# Patient Record
Sex: Female | Born: 2000 | Race: Black or African American | Hispanic: No | Smoking: Never smoker
Health system: Southern US, Community
[De-identification: ages and names within clinical notes are randomized; demographics above are authoritative.]

## PROBLEM LIST (undated history)

## (undated) DIAGNOSIS — B338 Other specified viral diseases: Secondary | ICD-10-CM

## (undated) DIAGNOSIS — O039 Complete or unspecified spontaneous abortion without complication: Secondary | ICD-10-CM

## (undated) DIAGNOSIS — F32A Depression, unspecified: Secondary | ICD-10-CM

## (undated) DIAGNOSIS — F410 Panic disorder [episodic paroxysmal anxiety] without agoraphobia: Secondary | ICD-10-CM

## (undated) DIAGNOSIS — B974 Respiratory syncytial virus as the cause of diseases classified elsewhere: Secondary | ICD-10-CM

## (undated) HISTORY — DX: Complete or unspecified spontaneous abortion without complication: O03.9

## (undated) HISTORY — DX: Depression, unspecified: F32.A

---
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
## 2021-06-05 DIAGNOSIS — Z713 Dietary counseling and surveillance: Secondary | ICD-10-CM | POA: Diagnosis not present

## 2021-06-05 DIAGNOSIS — Z68.41 Body mass index (BMI) pediatric, 85th percentile to less than 95th percentile for age: Secondary | ICD-10-CM | POA: Diagnosis not present

## 2021-06-05 DIAGNOSIS — G43809 Other migraine, not intractable, without status migrainosus: Secondary | ICD-10-CM | POA: Diagnosis not present

## 2021-06-05 DIAGNOSIS — Z113 Encounter for screening for infections with a predominantly sexual mode of transmission: Secondary | ICD-10-CM | POA: Diagnosis not present

## 2021-06-05 DIAGNOSIS — Z0001 Encounter for general adult medical examination with abnormal findings: Secondary | ICD-10-CM | POA: Diagnosis not present

## 2021-06-05 DIAGNOSIS — Z23 Encounter for immunization: Secondary | ICD-10-CM | POA: Diagnosis not present

## 2021-09-25 ENCOUNTER — Encounter: Payer: Self-pay | Admitting: Obstetrics and Gynecology

## 2021-09-25 ENCOUNTER — Ambulatory Visit: Payer: BC Managed Care – PPO | Admitting: Obstetrics and Gynecology

## 2021-09-25 ENCOUNTER — Other Ambulatory Visit: Payer: Self-pay

## 2021-09-25 VITALS — BP 132/74 | HR 111 | Ht 61.0 in | Wt 142.9 lb

## 2021-09-25 DIAGNOSIS — Z3200 Encounter for pregnancy test, result unknown: Secondary | ICD-10-CM

## 2021-09-25 LAB — POCT URINE PREGNANCY: Preg Test, Ur: POSITIVE — AB

## 2021-09-25 NOTE — Progress Notes (Signed)
HPI:      [name] is a 21 y.o. G1P0 who LMP was Patient's last menstrual period was 07/18/2021 (exact date).  Subjective:   She presents today after being seen elsewhere and having 2 ultrasounds several days apart both showing a baby present but without a heartbeat.  9-week sac 5-week crown-rump length. (All the above information given by the patient -actual medical records not available) Patient has not been counseled regarding possibility of miscarriage or nonviability of baby. She is taking prenatal vitamins. She reports that she has not had any bleeding or cramping.    Hx: The following portions of the patient's history were reviewed and updated as appropriate:             She  has a past medical history of Panic attacks and RSV (respiratory syncytial virus infection). She does not have any pertinent problems on file. She  has no past surgical history on file. Her family history includes Cancer in her maternal aunt, maternal grandmother, and paternal grandfather; Heart disease in her maternal aunt, maternal grandmother, paternal grandmother, and paternal uncle; Hypertension in her maternal grandmother, mother, and paternal grandmother. She  reports that she has never smoked. She does not have any smokeless tobacco history on file. She reports that she does not drink alcohol and does not use drugs. She has a current medication list which includes the following prescription(s): prenatal vit-fe fumarate-fa. She has No Known Allergies.       Review of Systems:  Review of Systems  Constitutional: Denied constitutional symptoms, night sweats, recent illness, fatigue, fever, insomnia and weight loss.  Eyes: Denied eye symptoms, eye pain, photophobia, vision change and visual disturbance.  Ears/Nose/Throat/Neck: Denied ear, nose, throat or neck symptoms, hearing loss, nasal discharge, sinus congestion and sore throat.  Cardiovascular: Denied cardiovascular symptoms, arrhythmia, chest  pain/pressure, edema, exercise intolerance, orthopnea and palpitations.  Respiratory: Denied pulmonary symptoms, asthma, pleuritic pain, productive sputum, cough, dyspnea and wheezing.  Gastrointestinal: Denied, gastro-esophageal reflux, melena, nausea and vomiting.  Genitourinary: Denied genitourinary symptoms including symptomatic vaginal discharge, pelvic relaxation issues, and urinary complaints.  Musculoskeletal: Denied musculoskeletal symptoms, stiffness, swelling, muscle weakness and myalgia.  Dermatologic: Denied dermatology symptoms, rash and scar.  Neurologic: Denied neurology symptoms, dizziness, headache, neck pain and syncope.  Psychiatric: Denied psychiatric symptoms, anxiety and depression.  Endocrine: Denied endocrine symptoms including hot flashes and night sweats.   Meds:   Current Outpatient Medications on File Prior to Visit  Medication Sig Dispense Refill   Prenatal Vit-Fe Fumarate-FA (PRENATAL PO) Take by mouth.     No current facility-administered medications on file prior to visit.      Objective:     Vitals:   09/25/21 0857  BP: 132/74  Pulse: (!) 111   Filed Weights   09/25/21 0857  Weight: 142 lb 14.4 oz (64.8 kg)                        Assessment:    G1P0 Patient Active Problem List   Diagnosis Date Noted   Altered mental status 01/30/2014     1. Possible pregnancy, not yet confirmed     Based on 2 previous ultrasounds showing crown-rump length without a heartbeat this is likely a nonviable pregnancy.  Because I do not have access to the ultrasound I am only going by what the patient has told me.   Plan:            1.  Ultrasound as soon as possible to confirm viability.  2.  Patient is counseled regarding the possibility of miscarriage and options of management for miscarriage discussed.  3.  If baby viable or nonviable by ultrasound I recommend follow-up visit next week.  4.  Warning signs of possible miscarriage discussed with  patient.   Orders Orders Placed This Encounter  Procedures   POCT urine pregnancy    No orders of the defined types were placed in this encounter.     F/U  Return in about 1 week (around 10/02/2021). I spent 31 minutes involved in the care of this patient preparing to see the patient by obtaining and reviewing her medical history (including labs, imaging tests and prior procedures), documenting clinical information in the electronic health record (EHR), counseling and coordinating care plans, writing and sending prescriptions, ordering tests or procedures and in direct communicating with the patient and medical staff discussing pertinent items from her history and physical exam.  Finis Bud, M.D. 09/25/2021 9:25 AM

## 2021-09-25 NOTE — Progress Notes (Signed)
Patient presents today for pregnancy confirmation. UPT resulted in positive. Patient states prior ultrasound with [facility]. Patient states no other questions or concerns.

## 2021-09-26 ENCOUNTER — Other Ambulatory Visit: Payer: Self-pay | Admitting: Obstetrics and Gynecology

## 2021-09-26 ENCOUNTER — Other Ambulatory Visit (INDEPENDENT_AMBULATORY_CARE_PROVIDER_SITE_OTHER): Payer: BC Managed Care – PPO

## 2021-09-26 DIAGNOSIS — O3680X0 Pregnancy with inconclusive fetal viability, not applicable or unspecified: Secondary | ICD-10-CM

## 2021-09-28 ENCOUNTER — Emergency Department: Payer: BC Managed Care – PPO

## 2021-09-28 ENCOUNTER — Emergency Department
Admission: EM | Admit: 2021-09-28 | Discharge: 2021-09-28 | Disposition: A | Discharge status: Home / Self Care | Payer: BC Managed Care – PPO | Attending: Emergency Medicine | Admitting: Emergency Medicine

## 2021-09-28 ENCOUNTER — Encounter: Payer: Self-pay | Admitting: Emergency Medicine

## 2021-09-28 ENCOUNTER — Other Ambulatory Visit: Payer: Self-pay

## 2021-09-28 DIAGNOSIS — O209 Hemorrhage in early pregnancy, unspecified: Secondary | ICD-10-CM | POA: Diagnosis not present

## 2021-09-28 DIAGNOSIS — Z3A00 Weeks of gestation of pregnancy not specified: Secondary | ICD-10-CM | POA: Diagnosis not present

## 2021-09-28 DIAGNOSIS — N939 Abnormal uterine and vaginal bleeding, unspecified: Secondary | ICD-10-CM

## 2021-09-28 DIAGNOSIS — O039 Complete or unspecified spontaneous abortion without complication: Secondary | ICD-10-CM | POA: Insufficient documentation

## 2021-09-28 LAB — BASIC METABOLIC PANEL WITH GFR
Anion gap: 11 (ref 5–15)
BUN: 6 mg/dL (ref 6–20)
CO2: 23 mmol/L (ref 22–32)
Calcium: 9.8 mg/dL (ref 8.9–10.3)
Chloride: 105 mmol/L (ref 98–111)
Creatinine, Ser: 0.75 mg/dL (ref 0.44–1.00)
GFR, Estimated: >60 mL/min
Glucose, Bld: 83 mg/dL (ref 70–99)
Potassium: 3.8 mmol/L (ref 3.5–5.1)
Sodium: 139 mmol/L (ref 135–145)

## 2021-09-28 LAB — URINALYSIS, COMPLETE (UACMP) WITH MICROSCOPIC
Bacteria, UA: NONE SEEN
RBC / HPF: >50 RBC/hpf — ABNORMAL HIGH (ref 0–5)
Specific Gravity, Urine: 1.026 (ref 1.005–1.030)
Squamous Epithelial / HPF: NONE SEEN (ref 0–5)

## 2021-09-28 LAB — CBC
HCT: 44.5 % (ref 36.0–46.0)
Hemoglobin: 14.3 g/dL (ref 12.0–15.0)
MCH: 28.7 pg (ref 26.0–34.0)
MCHC: 32.1 g/dL (ref 30.0–36.0)
MCV: 89.2 fL (ref 80.0–100.0)
Platelets: 263 10*3/uL (ref 150–400)
RBC: 4.99 MIL/uL (ref 3.87–5.11)
RDW: 12.7 % (ref 11.5–15.5)
WBC: 18 10*3/uL — ABNORMAL HIGH (ref 4.0–10.5)
nRBC: 0 % (ref 0.0–0.2)

## 2021-09-28 LAB — ABO/RH: ABO/RH(D): B POS

## 2021-09-28 LAB — POC URINE PREG, ED: Preg Test, Ur: POSITIVE — AB

## 2021-09-28 LAB — HCG, QUANTITATIVE, PREGNANCY: hCG, Beta Chain, Quant, S: 3784 m[IU]/mL — ABNORMAL HIGH (ref <5)

## 2021-09-28 MED ORDER — ACETAMINOPHEN 500 MG PO TABS
1000.0000 mg | ORAL_TABLET | Freq: Once | ORAL | Status: AC
  Administered 2021-09-28: 1000 mg via ORAL
  Filled 2021-09-28: qty 2

## 2021-09-28 MED ORDER — OXYCODONE HCL 5 MG PO TABS
5.0000 mg | ORAL_TABLET | Freq: Once | ORAL | Status: AC
  Administered 2021-09-28: 5 mg via ORAL
  Filled 2021-09-28: qty 1

## 2021-09-28 MED ORDER — HYDROCODONE-ACETAMINOPHEN 5-325 MG PO TABS: 1.0000 | ORAL_TABLET | Freq: Four times a day (QID) | ORAL | 0 refills | Status: DC | PRN

## 2021-09-28 NOTE — ED Notes (Addendum)
Pt presents to ED with lower abdominal pain. Pt states it started last night and noticed dark red blood. Pt states she has had to change pads every hour and pads have been saturated each time she changes pad. Pt states she is [redacted] weeks pregnant. Pt denies having NVD.

## 2021-09-28 NOTE — ED Notes (Signed)
Pt transported to ultrasound.

## 2021-09-28 NOTE — ED Provider Notes (Signed)
[facility] Provider Note    Event Date/Time   First MD Initiated Contact with Patient 09/28/21 1841     (approximate)   History   Abdominal Pain   HPI  Sherrie Marsan is a 21 y.o. female  who, per ob/gyn note dated 09/25/2021 was seen for possible pregnancy. Apparently per that note the patient had already had two [place] without cardiac activity present although the ob/gyn did not see the records directly. She presents to the emergency department today because of concern for miscarriage. She is not answering questions due to the pain so answers were provided by companion at bedside. The symptoms started yesterday and consisted of pain and bleeding. The pain is severe. It has been constant. The patient states she was told to expect this from her ob gyn.    Physical Exam   Triage Vital Signs: ED Triage Vitals  Enc Vitals Group     BP 09/28/21 1718 (!) 143/101     Pulse Rate 09/28/21 1718 (!) 113     Resp 09/28/21 1718 18     Temp 09/28/21 1718 98.2 F (36.8 C)     Temp Source 09/28/21 1718 Oral     SpO2 09/28/21 1718 99 %     Weight 09/28/21 1716 142 lb (64.4 kg)     Height 09/28/21 1716 5\' 1"  (1.549 m)     Head Circumference --      Peak Flow --      Pain Score 09/28/21 1716 9     Pain Loc --      Pain Edu? --      Excl. in GC? --     Most recent vital signs: Vitals:   09/28/21 1718  BP: (!) 143/101  Pulse: (!) 113  Resp: 18  Temp: 98.2 F (36.8 C)  SpO2: 99%    General: Awake, no distress.  CV:  Good peripheral perfusion.  Resp:  Normal effort.  Abd:  No distention. Tender to palpation in the lower abdomen.   ED Results / Procedures / Treatments   Labs (all labs ordered are listed, but only abnormal results are displayed) Labs Reviewed  CBC - Abnormal; Notable for the following components:      Result Value   WBC 18.0 (*)    All other components within normal limits  HCG, QUANTITATIVE, PREGNANCY - Abnormal; Notable for the  following components:   hCG, Beta Chain, Quant, S 3,784 (*)    All other components within normal limits  URINALYSIS, COMPLETE (UACMP) WITH MICROSCOPIC - Abnormal; Notable for the following components:   Color, Urine RED (*)    APPearance TURBID (*)    Glucose, UA   (*)    Value: TEST NOT REPORTED DUE TO COLOR INTERFERENCE OF URINE PIGMENT   Hgb urine dipstick   (*)    Value: TEST NOT REPORTED DUE TO COLOR INTERFERENCE OF URINE PIGMENT   Bilirubin Urine   (*)    Value: TEST NOT REPORTED DUE TO COLOR INTERFERENCE OF URINE PIGMENT   Ketones, ur   (*)    Value: TEST NOT REPORTED DUE TO COLOR INTERFERENCE OF URINE PIGMENT   Protein, ur   (*)    Value: TEST NOT REPORTED DUE TO COLOR INTERFERENCE OF URINE PIGMENT   Nitrite   (*)    Value: TEST NOT REPORTED DUE TO COLOR INTERFERENCE OF URINE PIGMENT   Leukocytes,Ua   (*)    Value: TEST NOT REPORTED DUE TO COLOR  INTERFERENCE OF URINE PIGMENT   RBC / HPF >50 (*)    All other components within normal limits  POC URINE PREG, ED - Abnormal; Notable for the following components:   Preg Test, Ur POSITIVE (*)    All other components within normal limits  BASIC METABOLIC PANEL  ABO/RH     EKG  None   RADIOLOGY Ob US IMPRESSION:  Previously seen intrauterine gestation no longer visualized which  would be compatible with spontaneous abortion. However,  study/visualization is limited and patient refused transvaginal  imaging. Patient reports scheduled follow-up ultrasound at [facility] office  on Thursday.     PROCEDURES:  Critical Care performed: No  Procedures   MEDICATIONS ORDERED IN ED: Medications  acetaminophen (TYLENOL) tablet 1,000 mg (has no administration in time range)     IMPRESSION / MDM / ASSESSMENT AND PLAN / ED COURSE  I reviewed the triage vital signs and the nursing notes.                              Differential diagnosis includes, but is not limited to, miscarriage, pain associate with pregnancy.  Patient  presented to the emergency department today because of concerns for abdominal pain and vaginal bleeding in the setting of early pregnancy which has apparently had  ultrasounds which did not show cardiac activity.  Ultrasound today could not visualize the embryo.  At this time given history of previous ultrasounds which showed no cardiac activity do think unfortunately that her pain and bleeding today represents a miscarriage.  I discussed this with the patient.  Given significant pain she was given narcotic pain medication which did help relieve her pain.  Will plan on discharging with short course of pain medication and discussed close follow-up with OB/GYN.     FINAL CLINICAL IMPRESSION(S) / ED DIAGNOSES   Final diagnoses:  Abnormal vaginal bleeding  Miscarriage     Rx / DC Orders   ED Discharge Orders          Ordered    HYDROcodone-acetaminophen (NORCO/VICODIN) 5-325 MG tablet  Every 6 hours PRN        09/28/21 2140             Note:  This document was prepared using Dragon voice recognition software and may include unintentional dictation errors.    Phineas Semen, MD 09/28/21 [id number]

## 2021-09-28 NOTE — ED Notes (Signed)
Pt verbalized understanding of discharge instructions, prescription medication, and follow-up care instructions. Pt advised if any symptoms worsen to return to ED. E-signature not available due to signature pad not working at this time.

## 2021-09-28 NOTE — Discharge Instructions (Signed)
Please seek medical attention for any high fevers, chest pain, shortness of breath, change in behavior, persistent vomiting, bloody stool or any other new or concerning symptoms.  

## 2021-09-28 NOTE — ED Notes (Addendum)
Ultrasound called this RN to notify that pt refused transvaginal ultrasound but pt states she has a follow-up appointment on Thursday.

## 2021-09-28 NOTE — ED Triage Notes (Signed)
Pt via POV from home. Pt c/o vaginal bleeding with clots and abd pain that started last night. Pt believes  she is having a miscarriage pt is about [redacted] week pregnant. Pt is A&OX4 and NAD

## 2021-09-29 ENCOUNTER — Telehealth: Payer: Self-pay | Admitting: Obstetrics and Gynecology

## 2021-09-29 NOTE — Telephone Encounter (Signed)
Pt was seen in [facility] ER yesterday stating that she had a miscarriage- she was directed to follow up with Dr.Evans for evaluation for D&C. Pt is still experiencing bleeding and some pain- manageable with meds rx in er. I went ahead and scheduled pt for 3-1 at 3:30. Please advise as far as scheduling.

## 2021-10-01 ENCOUNTER — Encounter: Payer: BC Managed Care – PPO | Admitting: Obstetrics and Gynecology

## 2021-10-01 HISTORY — PX: WISDOM TOOTH EXTRACTION: SHX21

## 2021-10-02 ENCOUNTER — Encounter: Payer: PRIVATE HEALTH INSURANCE | Admitting: Obstetrics and Gynecology

## 2021-10-03 ENCOUNTER — Other Ambulatory Visit: Payer: Self-pay

## 2021-10-03 ENCOUNTER — Encounter: Payer: Self-pay | Admitting: Obstetrics and Gynecology

## 2021-10-03 ENCOUNTER — Ambulatory Visit: Payer: BC Managed Care – PPO | Admitting: Obstetrics and Gynecology

## 2021-10-03 VITALS — BP 122/84 | HR 87 | Ht 61.0 in | Wt 140.0 lb

## 2021-10-03 DIAGNOSIS — O039 Complete or unspecified spontaneous abortion without complication: Secondary | ICD-10-CM

## 2021-10-03 NOTE — Progress Notes (Signed)
HPI: ?     Ms. Curley Fayette is a 21 y.o. G1P0010 who LMP was Patient's last menstrual period was 07/18/2021 (exact date). ? ?Subjective:  ? ?She presents today after being seen in the emergency department approximately 1 week ago after having a spontaneous miscarriage.  She has had previous ultrasounds showing a crown-rump length without a heartbeat and an impending miscarriage was suspected. ?She presents today stating that her bleeding is currently light.  She occasionally has heavier bleeding with cramping but nothing like when she went to the emergency department. ?She continues to take prenatal vitamins. ?She reports that she has not yet decided upon whether she would become pregnant in the near future or if she would like birth control. ? ?  Hx: ?The following portions of the patient's history were reviewed and updated as appropriate: ?            She  has a past medical history of Miscarriage, Panic attacks, and RSV (respiratory syncytial virus infection). ?She does not have any pertinent problems on file. ?She  has no past surgical history on file. ?Her family history includes Cancer in her maternal aunt, maternal grandmother, and paternal grandfather; Heart disease in her maternal aunt, maternal grandmother, paternal grandmother, and paternal uncle; Hypertension in her maternal grandmother, mother, and paternal grandmother. ?She  reports that she has never smoked. She does not have any smokeless tobacco history on file. She reports that she does not drink alcohol and does not use drugs. ?She has a current medication list which includes the following prescription(s): acetaminophen, prenatal vit-fe fumarate-fa, and hydrocodone-acetaminophen. ?She has No Known Allergies. ?      ?Review of Systems:  ?Review of Systems ? ?Constitutional: Denied constitutional symptoms, night sweats, recent illness, fatigue, fever, insomnia and weight loss.  ?Eyes: Denied eye symptoms, eye pain, photophobia, vision change and  visual disturbance.  ?Ears/Nose/Throat/Neck: Denied ear, nose, throat or neck symptoms, hearing loss, nasal discharge, sinus congestion and sore throat.  ?Cardiovascular: Denied cardiovascular symptoms, arrhythmia, chest pain/pressure, edema, exercise intolerance, orthopnea and palpitations.  ?Respiratory: Denied pulmonary symptoms, asthma, pleuritic pain, productive sputum, cough, dyspnea and wheezing.  ?Gastrointestinal: Denied, gastro-esophageal reflux, melena, nausea and vomiting.  ?Genitourinary: Denied genitourinary symptoms including symptomatic vaginal discharge, pelvic relaxation issues, and urinary complaints.  ?Musculoskeletal: Denied musculoskeletal symptoms, stiffness, swelling, muscle weakness and myalgia.  ?Dermatologic: Denied dermatology symptoms, rash and scar.  ?Neurologic: Denied neurology symptoms, dizziness, headache, neck pain and syncope.  ?Psychiatric: Denied psychiatric symptoms, anxiety and depression.  ?Endocrine: Denied endocrine symptoms including hot flashes and night sweats.  ? ?Meds: ?  ?Current Outpatient Medications on File Prior to Visit  ?Medication Sig Dispense Refill  ? Acetaminophen (TYLENOL PO) Take by mouth.    ? Prenatal Vit-Fe Fumarate-FA (PRENATAL PO) Take by mouth.    ? HYDROcodone-acetaminophen (NORCO/VICODIN) 5-325 MG tablet Take 1 tablet by mouth every 6 (six) hours as needed for moderate pain. (Patient not taking: Reported on 10/03/2021) 10 tablet 0  ? ?No current facility-administered medications on file prior to visit.  ? ? ? ? ?Objective:  ?  ? ?Vitals:  ? 10/03/21 0832  ?BP: 122/84  ?Pulse: 87  ? ?Filed Weights  ? 10/03/21 0832  ?Weight: 140 lb (63.5 kg)  ? ?  ?          ?        ? ?Assessment:  ?  ?G1P0010 ?Patient Active Problem List  ? Diagnosis Date Noted  ? Altered mental status 01/30/2014  ? ?  ?  1. Miscarriage   ? ? Based on symptoms and ultrasound to consider this a complete miscarriage. ? ? ?Plan:  ?  ?       ? 1.  Plan follow-up in 4 weeks to discuss  future pregnancy versus birth control.  Patient feels she needs this time to consider her options.  All questions answered. ?Orders ?No orders of the defined types were placed in this encounter. ? ? No orders of the defined types were placed in this encounter. ?  ?  F/U ? Return in about 4 weeks (around 10/31/2021). ?I spent 18 minutes involved in the care of this patient preparing to see the patient by obtaining and reviewing her medical history (including labs, imaging tests and prior procedures), documenting clinical information in the electronic health record (EHR), counseling and coordinating care plans, writing and sending prescriptions, ordering tests or procedures and in direct communicating with the patient and medical staff discussing pertinent items from her history and physical exam. ? ?Elonda Husky, M.D. ?10/03/2021 ?9:05 AM ? ? ? ? ?

## 2021-10-03 NOTE — Progress Notes (Signed)
Patient presents today for recent miscarriage. Patient reports lightly bleeding along with cramping. She states cramping goes from moderate to severe and her bleeding increase while cramping.Patient states no questions at this time. ?

## 2021-10-29 ENCOUNTER — Ambulatory Visit: Payer: BC Managed Care – PPO | Admitting: Obstetrics and Gynecology

## 2021-10-29 ENCOUNTER — Encounter: Payer: Self-pay | Admitting: Obstetrics and Gynecology

## 2021-10-29 ENCOUNTER — Other Ambulatory Visit: Payer: Self-pay

## 2021-10-29 VITALS — BP 129/83 | HR 91 | Ht 61.0 in | Wt 138.1 lb

## 2021-10-29 DIAGNOSIS — O039 Complete or unspecified spontaneous abortion without complication: Secondary | ICD-10-CM

## 2021-10-29 DIAGNOSIS — Z3009 Encounter for other general counseling and advice on contraception: Secondary | ICD-10-CM

## 2021-10-29 NOTE — Progress Notes (Signed)
HPI: ?     [name] is a 21 y.o. G1P0010 who LMP was No LMP recorded. (Menstrual status: Other). ? ?Subjective:  ? ?She presents today for 1 month follow-up after miscarriage.  She reports she is no longer bleeding.  She thinks she might have ovulated 2 weeks ago.  She has had some significant sadness in the last few weeks but says that she is now starting to get better.  She would like to become pregnant again and has questions about how soon this should happen. ? ?  Hx: ?The following portions of the patient's history were reviewed and updated as appropriate: ?            She  has a past medical history of Miscarriage, Panic attacks, and RSV (respiratory syncytial virus infection). ?She does not have any pertinent problems on file. ?She  has no past surgical history on file. ?Her family history includes Cancer in her maternal aunt, maternal grandmother, and paternal grandfather; Heart disease in her maternal aunt, maternal grandmother, paternal grandmother, and paternal uncle; Hypertension in her maternal grandmother, mother, and paternal grandmother. ?She  reports that she has never smoked. She does not have any smokeless tobacco history on file. She reports that she does not drink alcohol and does not use drugs. ?She has a current medication list which includes the following prescription(s): prenatal vit-fe fumarate-fa. ?She has No Known Allergies. ?      ?Review of Systems:  ?Review of Systems ? ?Constitutional: Denied constitutional symptoms, night sweats, recent illness, fatigue, fever, insomnia and weight loss.  ?Eyes: Denied eye symptoms, eye pain, photophobia, vision change and visual disturbance.  ?Ears/Nose/Throat/Neck: Denied ear, nose, throat or neck symptoms, hearing loss, nasal discharge, sinus congestion and sore throat.  ?Cardiovascular: Denied cardiovascular symptoms, arrhythmia, chest pain/pressure, edema, exercise intolerance, orthopnea and palpitations.  ?Respiratory: Denied pulmonary  symptoms, asthma, pleuritic pain, productive sputum, cough, dyspnea and wheezing.  ?Gastrointestinal: Denied, gastro-esophageal reflux, melena, nausea and vomiting.  ?Genitourinary: Denied genitourinary symptoms including symptomatic vaginal discharge, pelvic relaxation issues, and urinary complaints.  ?Musculoskeletal: Denied musculoskeletal symptoms, stiffness, swelling, muscle weakness and myalgia.  ?Dermatologic: Denied dermatology symptoms, rash and scar.  ?Neurologic: Denied neurology symptoms, dizziness, headache, neck pain and syncope.  ?Psychiatric: Denied psychiatric symptoms, anxiety and depression.  ?Endocrine: Denied endocrine symptoms including hot flashes and night sweats.  ? ?Meds: ?  ?Current Outpatient Medications on File Prior to Visit  ?Medication Sig Dispense Refill  ? Prenatal Vit-Fe Fumarate-FA (PRENATAL PO) Take by mouth.    ? ?No current facility-administered medications on file prior to visit.  ? ? ? ? ?Objective:  ?  ? ?Vitals:  ? 10/29/21 0941  ?BP: 129/83  ?Pulse: 91  ? ?Filed Weights  ? 10/29/21 0941  ?Weight: 138 lb 1.6 oz (62.6 kg)  ? ?  ?          ?        ? ?Assessment:  ?  ?G1P0010 ?Patient Active Problem List  ? Diagnosis Date Noted  ? Altered mental status 01/30/2014  ? ?  ?1. Birth control counseling   ?2. Miscarriage   ? ? Patient with some appropriate sadness surrounding her miscarriage but is now doing better.  We discussed the possibility of antidepressants and she does not desire these and in my opinion does not need them at this time.  Desires pregnancy in the near future ? ? ?Plan:  ?  ?       ? 1.  Discussed miscarriage in detail.  Recommend 3 months from miscarriage before attempting pregnancy again.  Rationale discussed. ? 2.  To continue prenatal vitamins ? 3.  Inform us as soon as she becomes pregnant again. ? ? ?Orders ?No orders of the defined types were placed in this encounter. ? ? No orders of the defined types were placed in this encounter. ?  ?  F/U ? Return  for Annual Physical. ?I spent 20 minutes involved in the care of this patient preparing to see the patient by obtaining and reviewing her medical history (including labs, imaging tests and prior procedures), documenting clinical information in the electronic health record (EHR), counseling and coordinating care plans, writing and sending prescriptions, ordering tests or procedures and in direct communicating with the patient and medical staff discussing pertinent items from her history and physical exam. ? ?Elonda Husky, M.D. ?10/29/2021 ?10:01 AM ? ? ? ? ?

## 2021-10-29 NOTE — Progress Notes (Signed)
Patient presents today for follow-up post SAB. She reports feeling a little depressed and sad since the event. Patient reports she wants to try and get pregnant again. She also is curious when her period may return. Patient states no other questions or concerns at this time.  ?

## 2021-10-31 ENCOUNTER — Encounter: Payer: BC Managed Care – PPO | Admitting: Obstetrics and Gynecology

## 2021-11-07 ENCOUNTER — Encounter: Payer: PRIVATE HEALTH INSURANCE | Admitting: Obstetrics

## 2021-11-14 ENCOUNTER — Encounter: Payer: Self-pay | Admitting: Obstetrics

## 2022-01-14 ENCOUNTER — Encounter: Payer: Self-pay | Admitting: Obstetrics and Gynecology

## 2022-01-14 ENCOUNTER — Ambulatory Visit (INDEPENDENT_AMBULATORY_CARE_PROVIDER_SITE_OTHER): Payer: BC Managed Care – PPO | Admitting: Obstetrics and Gynecology

## 2022-01-14 VITALS — BP 127/82 | HR 81 | Ht 61.0 in | Wt 132.4 lb

## 2022-01-14 DIAGNOSIS — Z01419 Encounter for gynecological examination (general) (routine) without abnormal findings: Secondary | ICD-10-CM | POA: Diagnosis not present

## 2022-01-14 DIAGNOSIS — F418 Other specified anxiety disorders: Secondary | ICD-10-CM

## 2022-01-14 MED ORDER — SERTRALINE HCL 50 MG PO TABS: 50.0000 mg | ORAL_TABLET | Freq: Every day | ORAL | 0 refills | Status: DC

## 2022-01-14 NOTE — Progress Notes (Signed)
HPI:      Ms. Amberley Hamler is a 21 y.o. G1P0010 who LMP was Patient's last menstrual period was 01/11/2022 (exact date).  Subjective:   She presents today scheduled as an annual examination however she is not due for a Pap and has more pressing issues.  She reports that she has significant anxiety and depression daily since her miscarriage.  She says that she is often thinking "what if" regarding the baby regarding her life etc.  She ends up not sleeping well, not eating well, chewing her lip, grinding her teeth etc.  She says going to work does not help.  She is interested in receiving some counseling.  She reports that she has no significant thoughts of harming herself and no plan toward that end. She is having normal regular menses.  She is not sexually active and does not desire birth control at this time.    Hx: The following portions of the patient's history were reviewed and updated as appropriate:             She  has a past medical history of Miscarriage, Panic attacks, and RSV (respiratory syncytial virus infection). She does not have any pertinent problems on file. She  has no past surgical history on file. Her family history includes Cancer in her maternal aunt, maternal grandmother, and paternal grandfather; Heart disease in her maternal aunt, maternal grandmother, paternal grandmother, and paternal uncle; Hypertension in her maternal grandmother, mother, and paternal grandmother. She  reports that she has never smoked. She has never used smokeless tobacco. She reports that she does not drink alcohol and does not use drugs. She has a current medication list which includes the following prescription(s): prenatal vit-fe fumarate-fa. She has No Known Allergies.       Review of Systems:  Review of Systems  Constitutional: Denied constitutional symptoms, night sweats, recent illness, fatigue, fever, insomnia and weight loss.  Eyes: Denied eye symptoms, eye pain, photophobia, vision  change and visual disturbance.  Ears/Nose/Throat/Neck: Denied ear, nose, throat or neck symptoms, hearing loss, nasal discharge, sinus congestion and sore throat.  Cardiovascular: Denied cardiovascular symptoms, arrhythmia, chest pain/pressure, edema, exercise intolerance, orthopnea and palpitations.  Respiratory: Denied pulmonary symptoms, asthma, pleuritic pain, productive sputum, cough, dyspnea and wheezing.  Gastrointestinal: Denied, gastro-esophageal reflux, melena, nausea and vomiting.  Genitourinary: Denied genitourinary symptoms including symptomatic vaginal discharge, pelvic relaxation issues, and urinary complaints.  Musculoskeletal: Denied musculoskeletal symptoms, stiffness, swelling, muscle weakness and myalgia.  Dermatologic: Denied dermatology symptoms, rash and scar.  Neurologic: Denied neurology symptoms, dizziness, headache, neck pain and syncope.  Psychiatric: See HPI for additional information.  Endocrine: Denied endocrine symptoms including hot flashes and night sweats.   Meds:   Current Outpatient Medications on File Prior to Visit  Medication Sig Dispense Refill   Prenatal Vit-Fe Fumarate-FA (PRENATAL PO) Take by mouth.     No current facility-administered medications on file prior to visit.      Objective:     Vitals:   01/14/22 1337  BP: 127/82  Pulse: 81   Filed Weights   01/14/22 1337  Weight: 132 lb 6.4 oz (60.1 kg)                        Assessment:    G1P0010 Patient Active Problem List   Diagnosis Date Noted   Altered mental status 01/30/2014     1. Well woman exam with routine gynecological exam   2. Depression with anxiety  Plan:            1.  We have discussed depression in detail.  I have prescribed Zoloft.  2.  We have discussed grief counseling regarding fetal loss and literature was given for multiple health groups.  3.  We have discussed the benefits of counseling regarding depression and anxiety and I have given  her literature regarding several options that she can call and she is from.  4.  Plan follow-up for depression in 8 weeks. Orders No orders of the defined types were placed in this encounter.   No orders of the defined types were placed in this encounter.     F/U  Return in about 2 months (around 03/16/2022).  Elonda Husky, M.D. 01/14/2022 2:05 PM

## 2022-01-14 NOTE — Progress Notes (Signed)
Patients presents for annual exam today. She states that since her recent miscarriage in March she has been very depressed. Patient states sleeping and eating habits have gotten worse and she is interested in therapy. She states her cycles have restarted and have been light so far with moderate cramping, uninterested in birth control at this time. Patient is not due for pap smear due to age. Patient states no other questions or concerns at this time.

## 2022-03-17 ENCOUNTER — Ambulatory Visit: Payer: BC Managed Care – PPO | Admitting: Obstetrics and Gynecology

## 2022-03-17 ENCOUNTER — Encounter: Payer: Self-pay | Admitting: Obstetrics and Gynecology

## 2022-03-17 VITALS — BP 121/79 | HR 57 | Ht 61.0 in | Wt 132.6 lb

## 2022-03-17 DIAGNOSIS — F418 Other specified anxiety disorders: Secondary | ICD-10-CM

## 2022-03-17 MED ORDER — SERTRALINE HCL 50 MG PO TABS: 50.0000 mg | ORAL_TABLET | Freq: Every day | ORAL | 0 refills | Status: DC

## 2022-03-17 NOTE — Progress Notes (Signed)
Patient presents today for medication follow-up. She states since beginning medication and doing counseling she has noticed a big difference. Patient states today was hard coming into the office seeing other pregnant patients. PHQ-9: 9 GAD-7: 11No other concerns today.

## 2022-03-17 NOTE — Progress Notes (Signed)
HPI:      [name] is a 21 y.o. G1P0010 who LMP was Patient's last menstrual period was 02/10/2022 (approximate).  Subjective:   She presents today for follow-up of depression following a miscarriage.  She has been taking Zoloft and going to [facility] 1 time per week.  She says she is dramatically improved.  She says that she feels better every day.  She would like to continue the Zoloft and the counseling. She reports that she is not sexually active and does not desire anything of birth control at this time    Hx: The following portions of the patient's history were reviewed and updated as appropriate:             She  has a past medical history of Miscarriage, Panic attacks, and RSV (respiratory syncytial virus infection). She does not have any pertinent problems on file. She  has no past surgical history on file. Her family history includes Cancer in her maternal aunt, maternal grandmother, and paternal grandfather; Heart disease in her maternal aunt, maternal grandmother, paternal grandmother, and paternal uncle; Hypertension in her maternal grandmother, mother, and paternal grandmother. She  reports that she has never smoked. She has never used smokeless tobacco. She reports that she does not drink alcohol and does not use drugs. She has a current medication list which includes the following prescription(s): prenatal vit-fe fumarate-fa and sertraline. She has No Known Allergies.       Review of Systems:  Review of Systems  Constitutional: Denied constitutional symptoms, night sweats, recent illness, fatigue, fever, insomnia and weight loss.  Eyes: Denied eye symptoms, eye pain, photophobia, vision change and visual disturbance.  Ears/Nose/Throat/Neck: Denied ear, nose, throat or neck symptoms, hearing loss, nasal discharge, sinus congestion and sore throat.  Cardiovascular: Denied cardiovascular symptoms, arrhythmia, chest pain/pressure, edema, exercise intolerance,  orthopnea and palpitations.  Respiratory: Denied pulmonary symptoms, asthma, pleuritic pain, productive sputum, cough, dyspnea and wheezing.  Gastrointestinal: Denied, gastro-esophageal reflux, melena, nausea and vomiting.  Genitourinary: Denied genitourinary symptoms including symptomatic vaginal discharge, pelvic relaxation issues, and urinary complaints.  Musculoskeletal: Denied musculoskeletal symptoms, stiffness, swelling, muscle weakness and myalgia.  Dermatologic: Denied dermatology symptoms, rash and scar.  Neurologic: Denied neurology symptoms, dizziness, headache, neck pain and syncope.  Psychiatric: Denied psychiatric symptoms, anxiety and depression.  Endocrine: Denied endocrine symptoms including hot flashes and night sweats.   Meds:   Current Outpatient Medications on File Prior to Visit  Medication Sig Dispense Refill   Prenatal Vit-Fe Fumarate-FA (PRENATAL PO) Take by mouth.     No current facility-administered medications on file prior to visit.      Objective:     Vitals:   03/17/22 1439  BP: 121/79  Pulse: (!) 57   Filed Weights   03/17/22 1439  Weight: 132 lb 9.6 oz (60.1 kg)                        Assessment:    G1P0010 Patient Active Problem List   Diagnosis Date Noted   Altered mental status 01/30/2014     1. Depression with anxiety     Depression scores much improved-patient still going to counseling  Feels that Zoloft has made a difference   Plan:            1.  Continue Zoloft  2.  Encouraged to continue counseling  3.  Patient will inform [place] if she desires birth control. Orders No orders of the  defined types were placed in this encounter.    Meds ordered this encounter  Medications   sertraline (ZOLOFT) 50 MG tablet    Sig: Take 1 tablet (50 mg total) by mouth daily.    Dispense:  90 tablet    Refill:  0      F/U  No follow-ups on file. I spent 13 minutes involved in the care of this patient preparing to see the patient  by obtaining and reviewing her medical history (including labs, imaging tests and prior procedures), documenting clinical information in the electronic health record (EHR), counseling and coordinating care plans, writing and sending prescriptions, ordering tests or procedures and in direct communicating with the patient and medical staff discussing pertinent items from her history and physical exam.  Elonda Husky, M.D. 03/17/2022 2:48 PM

## 2022-04-27 DIAGNOSIS — Z113 Encounter for screening for infections with a predominantly sexual mode of transmission: Secondary | ICD-10-CM | POA: Diagnosis not present

## 2022-05-22 DIAGNOSIS — F331 Major depressive disorder, recurrent, moderate: Secondary | ICD-10-CM | POA: Diagnosis not present

## 2022-06-05 DIAGNOSIS — F331 Major depressive disorder, recurrent, moderate: Secondary | ICD-10-CM | POA: Diagnosis not present

## 2022-06-12 ENCOUNTER — Ambulatory Visit (INDEPENDENT_AMBULATORY_CARE_PROVIDER_SITE_OTHER): Payer: BC Managed Care – PPO

## 2022-06-12 ENCOUNTER — Other Ambulatory Visit: Payer: Self-pay

## 2022-06-12 VITALS — BP 132/88 | HR 90 | Ht 61.0 in | Wt 136.0 lb

## 2022-06-12 DIAGNOSIS — Z8759 Personal history of other complications of pregnancy, childbirth and the puerperium: Secondary | ICD-10-CM

## 2022-06-12 DIAGNOSIS — F331 Major depressive disorder, recurrent, moderate: Secondary | ICD-10-CM | POA: Diagnosis not present

## 2022-06-12 DIAGNOSIS — Z3201 Encounter for pregnancy test, result positive: Secondary | ICD-10-CM | POA: Diagnosis not present

## 2022-06-12 DIAGNOSIS — Z3200 Encounter for pregnancy test, result unknown: Secondary | ICD-10-CM

## 2022-06-12 LAB — POCT URINE PREGNANCY: Preg Test, Ur: POSITIVE — AB

## 2022-06-12 NOTE — Progress Notes (Signed)
Patient presents for evaluation of amenorrhea. She believes she could be pregnant.  Current symptoms also include,nausea and fatigue and some cramping.  Urine HCG: Briefly discussed pre-natal care options. She will schedule her appointments at check out.   LMP 05/14/2022

## 2022-06-13 LAB — BETA HCG QUANT (REF LAB): hCG Quant: 74 m[IU]/mL

## 2022-06-15 ENCOUNTER — Other Ambulatory Visit: Payer: Self-pay

## 2022-06-15 ENCOUNTER — Other Ambulatory Visit: Payer: BC Managed Care – PPO

## 2022-06-15 DIAGNOSIS — Z8759 Personal history of other complications of pregnancy, childbirth and the puerperium: Secondary | ICD-10-CM

## 2022-06-16 LAB — BETA HCG QUANT (REF LAB): hCG Quant: 150 m[IU]/mL

## 2022-06-19 DIAGNOSIS — F331 Major depressive disorder, recurrent, moderate: Secondary | ICD-10-CM | POA: Diagnosis not present

## 2022-06-29 ENCOUNTER — Other Ambulatory Visit: Payer: Self-pay

## 2022-06-29 ENCOUNTER — Encounter: Payer: Self-pay | Admitting: *Deleted

## 2022-06-29 ENCOUNTER — Emergency Department: Payer: BC Managed Care – PPO

## 2022-06-29 ENCOUNTER — Emergency Department
Admission: EM | Admit: 2022-06-29 | Discharge: 2022-06-29 | Disposition: A | Discharge status: Home / Self Care | Payer: BC Managed Care – PPO | Attending: Student in an Organized Health Care Education/Training Program | Admitting: Student in an Organized Health Care Education/Training Program

## 2022-06-29 DIAGNOSIS — O209 Hemorrhage in early pregnancy, unspecified: Secondary | ICD-10-CM | POA: Diagnosis not present

## 2022-06-29 DIAGNOSIS — Z3A00 Weeks of gestation of pregnancy not specified: Secondary | ICD-10-CM | POA: Diagnosis not present

## 2022-06-29 DIAGNOSIS — Z3A01 Less than 8 weeks gestation of pregnancy: Secondary | ICD-10-CM | POA: Diagnosis not present

## 2022-06-29 DIAGNOSIS — O3680X0 Pregnancy with inconclusive fetal viability, not applicable or unspecified: Secondary | ICD-10-CM | POA: Insufficient documentation

## 2022-06-29 DIAGNOSIS — O4690 Antepartum hemorrhage, unspecified, unspecified trimester: Secondary | ICD-10-CM

## 2022-06-29 LAB — CBC WITH DIFFERENTIAL/PLATELET
Abs Immature Granulocytes: 0.03 10*3/uL (ref 0.00–0.07)
Basophils Absolute: 0.1 10*3/uL (ref 0.0–0.1)
Basophils Relative: 0 %
Eosinophils Absolute: 0.1 10*3/uL (ref 0.0–0.5)
Eosinophils Relative: 1 %
HCT: 40.3 % (ref 36.0–46.0)
Hemoglobin: 13.1 g/dL (ref 12.0–15.0)
Immature Granulocytes: 0 %
Lymphocytes Relative: 26 %
Lymphs Abs: 2.9 10*3/uL (ref 0.7–4.0)
MCH: 28.7 pg (ref 26.0–34.0)
MCHC: 32.5 g/dL (ref 30.0–36.0)
MCV: 88.4 fL (ref 80.0–100.0)
Monocytes Absolute: 0.8 10*3/uL (ref 0.1–1.0)
Monocytes Relative: 7 %
Neutro Abs: 7.4 10*3/uL (ref 1.7–7.7)
Neutrophils Relative %: 66 %
Platelets: 268 10*3/uL (ref 150–400)
RBC: 4.56 MIL/uL (ref 3.87–5.11)
RDW: 12.4 % (ref 11.5–15.5)
WBC: 11.2 10*3/uL — ABNORMAL HIGH (ref 4.0–10.5)
nRBC: 0 % (ref 0.0–0.2)

## 2022-06-29 LAB — COMPREHENSIVE METABOLIC PANEL
ALT: 14 U/L (ref 0–44)
AST: 15 U/L (ref 15–41)
Albumin: 4.2 g/dL (ref 3.5–5.0)
Alkaline Phosphatase: 43 U/L (ref 38–126)
Anion gap: 7 (ref 5–15)
BUN: 10 mg/dL (ref 6–20)
CO2: 23 mmol/L (ref 22–32)
Calcium: 9.3 mg/dL (ref 8.9–10.3)
Chloride: 108 mmol/L (ref 98–111)
Creatinine, Ser: 0.66 mg/dL (ref 0.44–1.00)
GFR, Estimated: >60 mL/min (ref >60)
Glucose, Bld: 103 mg/dL — ABNORMAL HIGH (ref 70–99)
Potassium: 3.6 mmol/L (ref 3.5–5.1)
Sodium: 138 mmol/L (ref 135–145)
Total Bilirubin: 0.7 mg/dL (ref 0.3–1.2)
Total Protein: 6.9 g/dL (ref 6.5–8.1)

## 2022-06-29 LAB — URINALYSIS, ROUTINE W REFLEX MICROSCOPIC
Bilirubin Urine: NEGATIVE
Glucose, UA: NEGATIVE mg/dL
Ketones, ur: NEGATIVE mg/dL
Nitrite: NEGATIVE
Protein, ur: NEGATIVE mg/dL
Specific Gravity, Urine: 1.009 (ref 1.005–1.030)
pH: 5 (ref 5.0–8.0)

## 2022-06-29 LAB — POC URINE PREG, ED: Preg Test, Ur: POSITIVE — AB

## 2022-06-29 LAB — HCG, QUANTITATIVE, PREGNANCY: hCG, Beta Chain, Quant, S: 681 m[IU]/mL — ABNORMAL HIGH (ref <5)

## 2022-06-29 NOTE — ED Provider Notes (Signed)
[facility] Provider Note    Event Date/Time   First MD Initiated Contact with Patient 06/29/22 2034     (approximate)   History   Vaginal Bleeding   HPI  Kristi Bauer is a 21 y.o. female G2, P0 who presents to the ER on more than 1 month pregnant for evaluation of vaginal bleeding and cramping.  States that she did pass a few clots but no tissue.  States that she is B+.  Did have previous miscarriage roughly 9 weeks.  Denies any dysuria no fevers.  Denies any chest pain or shortness of breath.    Physical Exam   Triage Vital Signs: ED Triage Vitals  Enc Vitals Group     BP 06/29/22 1922 (!) 145/94     Pulse Rate 06/29/22 1922 (!) 110     Resp 06/29/22 1922 20     Temp 06/29/22 1922 98.5 F (36.9 C)     Temp Source 06/29/22 1922 Oral     SpO2 06/29/22 1922 98 %     Weight 06/29/22 1920 135 lb (61.2 kg)     Height 06/29/22 1920 5\' 1"  (1.549 m)     Head Circumference --      Peak Flow --      Pain Score 06/29/22 1920 5     Pain Loc --      Pain Edu? --      Excl. in [place]? --     Most recent vital signs: Vitals:   06/29/22 1922 06/29/22 2202  BP: (!) 145/94 116/76  Pulse: (!) 110 (!) 103  Resp: 20 20  Temp: 98.5 F (36.9 C) 98.7 F (37.1 C)  SpO2: 98% 100%     Constitutional: Alert  Eyes: Conjunctivae are normal.  Head: Atraumatic. Nose: No congestion/rhinnorhea. Mouth/Throat: Mucous membranes are moist.   Neck: Painless ROM.  Cardiovascular:   Good peripheral circulation. Respiratory: Normal respiratory effort.  No retractions.  Gastrointestinal: Soft and nontender.  Musculoskeletal:  no deformity Neurologic:  MAE spontaneously. No gross focal neurologic deficits are appreciated.  Skin:  Skin is warm, dry and intact. No rash noted. Psychiatric: Mood and affect are normal. Speech and behavior are normal.    ED Results / Procedures / Treatments   Labs (all labs ordered are listed, but only abnormal results are  displayed) Labs Reviewed  CBC WITH DIFFERENTIAL/PLATELET - Abnormal; Notable for the following components:      Result Value   WBC 11.2 (*)    All other components within normal limits  COMPREHENSIVE METABOLIC PANEL - Abnormal; Notable for the following components:   Glucose, Bld 103 (*)    All other components within normal limits  HCG, QUANTITATIVE, PREGNANCY - Abnormal; Notable for the following components:   hCG, Beta Chain, Quant, S 681 (*)    All other components within normal limits  URINALYSIS, ROUTINE W REFLEX MICROSCOPIC - Abnormal; Notable for the following components:   Color, Urine YELLOW (*)    APPearance HAZY (*)    Hgb urine dipstick MODERATE (*)    Leukocytes,Ua TRACE (*)    Bacteria, UA FEW (*)    All other components within normal limits  POC URINE PREG, ED - Abnormal; Notable for the following components:   Preg Test, Ur Positive (*)    All other components within normal limits     EKG     RADIOLOGY Please see ED Course for my review and interpretation.  I personally reviewed all radiographic  images ordered to evaluate for the above acute complaints and reviewed radiology reports and findings.  These findings were personally discussed with the patient.  Please see medical record for radiology report.    PROCEDURES:  Critical Care performed: No  Procedures   MEDICATIONS ORDERED IN ED: Medications - No data to display   IMPRESSION / MDM / ASSESSMENT AND PLAN / ED COURSE  I reviewed the triage vital signs and the nursing notes.                              Differential diagnosis includes, but is not limited to, threatened miscarriage, subchorionic hematoma, ectopic, abruption  Patient presenting to the ER for evaluation of symptoms as described above.  Based on symptoms, risk factors and considered above differential, this presenting complaint could reflect a potentially life-threatening illness therefore the patient will be placed on continuous  pulse oximetry and telemetry for monitoring.  Laboratory evaluation will be sent to evaluate for the above complaints.  Ultrasound will be ordered to evaluate for ectopic.    Clinical Course as of 06/29/22 2246  Mon Jun 29, 2022  2244 Patient reassessed.  Remains well-appearing in no acute distress.  Ultrasound findings concerning for pregnancy of unknown anatomic location.  Recommended pelvic exam to evaluate for cervical opening but patient has follow-up on Wednesday and would prefer to follow-up with her OB/GYN which I think is reasonable given her well appearance and would be unlikely to change management here in the ER.  She is not having any bleeding.  Her repeat abdominal exam is benign.  Have a lower suspicion for ectopic but discussed with her that that does remain on the differential and stressed the importance of follow-up for 48-hour repeat beta.  Patient demonstrates understanding [PR]  2245  of signs and symptoms for which she should return to the ER. [PR]    Clinical Course User Index [PR] Willy Eddy, MD   FINAL CLINICAL IMPRESSION(S) / ED DIAGNOSES   Final diagnoses:  Vaginal bleeding in pregnancy  Pregnancy of unknown anatomic location     Rx / DC Orders   ED Discharge Orders     None        Note:  This document was prepared using Dragon voice recognition software and may include unintentional dictation errors.    Willy Eddy, MD 06/29/22 2246

## 2022-06-29 NOTE — ED Notes (Signed)
Pt Dc to home. Dc instructions reviewed with all questions answered. Pt verbalizes understanding. Iv removed, cath intact, pressure dressing applied. No bleeding noted at site. Pt ambulatory out of dept with steady gait. 

## 2022-06-29 NOTE — ED Triage Notes (Signed)
Pt has abd pain with cramping and vag bleeding.  Pt states she is approx 1 month pregnant.  Pt alert

## 2022-06-29 NOTE — Discharge Instructions (Addendum)
Please follow up with OBGyn on Wednesday for repeat blood work.  Return to the ER if you have worsening pain, heavy bleeding or for any additional questions or concerns.

## 2022-06-30 ENCOUNTER — Telehealth: Payer: Self-pay

## 2022-06-30 DIAGNOSIS — Z8759 Personal history of other complications of pregnancy, childbirth and the puerperium: Secondary | ICD-10-CM

## 2022-06-30 NOTE — Telephone Encounter (Signed)
Pt called reporting she is having a miscarriage. She sates she has an U/S appointment with [place] tomorrow, She going to keep that to make sure everything has passed. I put in a beta so she can get her levels checked.

## 2022-07-01 ENCOUNTER — Other Ambulatory Visit: Payer: BC Managed Care – PPO

## 2022-07-06 ENCOUNTER — Ambulatory Visit (HOSPITAL_COMMUNITY)
Admission: RE | Admit: 2022-07-06 | Discharge: 2022-07-06 | Disposition: A | Discharge status: Home / Self Care | Payer: BC Managed Care – PPO | Source: Ambulatory Visit | Attending: Obstetrics and Gynecology | Admitting: Obstetrics and Gynecology

## 2022-07-06 ENCOUNTER — Other Ambulatory Visit (HOSPITAL_COMMUNITY): Payer: Self-pay | Admitting: Obstetrics and Gynecology

## 2022-07-06 DIAGNOSIS — O3680X0 Pregnancy with inconclusive fetal viability, not applicable or unspecified: Secondary | ICD-10-CM | POA: Diagnosis not present

## 2022-07-06 DIAGNOSIS — O039 Complete or unspecified spontaneous abortion without complication: Secondary | ICD-10-CM | POA: Diagnosis not present

## 2022-07-06 DIAGNOSIS — Z8759 Personal history of other complications of pregnancy, childbirth and the puerperium: Secondary | ICD-10-CM | POA: Diagnosis not present

## 2022-07-10 ENCOUNTER — Other Ambulatory Visit: Payer: Self-pay | Admitting: Obstetrics and Gynecology

## 2022-07-10 DIAGNOSIS — F418 Other specified anxiety disorders: Secondary | ICD-10-CM

## 2022-07-14 ENCOUNTER — Ambulatory Visit: Payer: BC Managed Care – PPO

## 2022-07-14 ENCOUNTER — Ambulatory Visit
Admission: EM | Admit: 2022-07-14 | Discharge: 2022-07-14 | Disposition: A | Discharge status: Home / Self Care | Payer: BC Managed Care – PPO | Attending: Emergency Medicine | Admitting: Emergency Medicine

## 2022-07-14 DIAGNOSIS — R109 Unspecified abdominal pain: Secondary | ICD-10-CM | POA: Diagnosis not present

## 2022-07-14 LAB — POCT URINALYSIS DIP (MANUAL ENTRY)
Bilirubin, UA: NEGATIVE
Glucose, UA: NEGATIVE mg/dL
Ketones, POC UA: NEGATIVE mg/dL
Leukocytes, UA: NEGATIVE
Nitrite, UA: NEGATIVE
Protein Ur, POC: NEGATIVE mg/dL
Spec Grav, UA: 1.01 (ref 1.010–1.025)
Urobilinogen, UA: 0.2 E.U./dL
pH, UA: 5.5 (ref 5.0–8.0)

## 2022-07-14 NOTE — ED Triage Notes (Addendum)
Patient to Urgent Care with complaints of lower back pain that is sharp and radiates into the right side of her groin. Pain started this morning. One episode of emesis this morning. Denies any diarrhea. No known injury.   Reports this morning her pain was a 10, after she vomited she reports the pain was relieved and is currently a 2/10.   Denies any fevers. Denies any urinary symptoms or vaginal discharge. Does report two episodes of brown colored urine over the last week.   Recent miscarriage, reports having a similar feeling in her back then.

## 2022-07-14 NOTE — ED Provider Notes (Signed)
Kristi Bauer    CSN: [id number] Arrival date & time: 07/14/22  0867      History   Chief Complaint Chief Complaint  Patient presents with   Back Pain    HPI Kristi Bauer is a 21 y.o. female.  Accompanied by her mother, patient presents with back pain since this morning.  No falls or injury.  She also reports 1 episode of emesis this morning which improved her back pain.  Her pain level dropped to 2/10 after vomiting.  No additional episodes of emesis since then.  No fever, chills, abdominal pain, fever, dysuria, vaginal discharge, pelvic pain, or other symptoms.  Last bowel movement was this morning.  She requests check of her urine for possible UTI.  Patient had a miscarriage 2 weeks ago.  She was seen at [facility] ED on 06/29/2022; diagnosed with vaginal bleeding in pregnancy, pregnancy of unknown anatomic location.  The history is provided by the patient, medical records and a parent.    Past Medical History:  Diagnosis Date   Miscarriage    Panic attacks    RSV (respiratory syncytial virus infection)    6 mo old, admitted at [facility]    Patient Active Problem List   Diagnosis Date Noted   Altered mental status 01/30/2014    History reviewed. No pertinent surgical history.  OB History     Gravida  2   Para      Term      Preterm      AB  1   Living         SAB  1   IAB      Ectopic      Multiple      Live Births               Home Medications    Prior to Admission medications   Medication Sig Start Date End Date Taking? Authorizing Provider  Prenatal Vit-Fe Fumarate-FA (PRENATAL PO) Take by mouth.    [provider]  sertraline (ZOLOFT) 50 MG tablet TAKE 1 TABLET BY MOUTH EVERY DAY 07/10/22   Linzie Collin, MD    Family History Family History  Problem Relation Age of Onset   Hypertension Mother    Cancer Maternal Aunt        breast   Heart disease Maternal Aunt    Heart disease Paternal Uncle    Cancer Maternal  Grandmother        breast   Heart disease Maternal Grandmother    Hypertension Maternal Grandmother    Heart disease Paternal Grandmother    Hypertension Paternal Grandmother    Cancer Paternal Grandfather        prostate    Social History Social History   Tobacco Use   Smoking status: Never   Smokeless tobacco: Never  Vaping Use   Vaping Use: Never used  Substance Use Topics   Alcohol use: No   Drug use: No     Allergies   Patient has no known allergies.   Review of Systems Review of Systems  Constitutional:  Negative for chills and fever.  Respiratory:  Negative for cough and shortness of breath.   Cardiovascular:  Negative for chest pain and palpitations.  Gastrointestinal:  Positive for nausea and vomiting. Negative for abdominal pain and diarrhea.  Genitourinary:  Positive for flank pain. Negative for dysuria, hematuria, pelvic pain, vaginal bleeding and vaginal discharge.  Musculoskeletal:  Positive for back pain. Negative for arthralgias,  gait problem and joint swelling.  Skin:  Negative for color change and rash.  All other systems reviewed and are negative.    Physical Exam Triage Vital Signs ED Triage Vitals  Enc Vitals Group     BP 07/14/22 0922 132/78     Pulse Rate 07/14/22 0922 74     Resp 07/14/22 0922 18     Temp 07/14/22 0922 98.9 F (37.2 C)     Temp src --      SpO2 07/14/22 0922 98 %     Weight 07/14/22 0920 135 lb (61.2 kg)     Height 07/14/22 0920 5' 1.5" (1.562 m)     Head Circumference --      Peak Flow --      Pain Score 07/14/22 0919 2     Pain Loc --      Pain Edu? --      Excl. in [place]? --    No data found.  Updated Vital Signs BP 132/78   Pulse 74   Temp 98.9 F (37.2 C)   Resp 18   Ht 5' 1.5" (1.562 m)   Wt 135 lb (61.2 kg)   LMP 05/14/2022 (Approximate) Comment: Recent miscarriage  SpO2 98%   Breastfeeding Unknown   BMI 25.09 kg/m   Visual Acuity Right Eye Distance:   Left Eye Distance:   Bilateral Distance:     Right Eye Near:   Left Eye Near:    Bilateral Near:     Physical Exam Vitals and nursing note reviewed.  Constitutional:      General: She is not in acute distress.    Appearance: She is well-developed.  HENT:     Head: Normocephalic and atraumatic.     Mouth/Throat:     Mouth: Mucous membranes are moist.  Cardiovascular:     Rate and Rhythm: Normal rate and regular rhythm.     Heart sounds: Normal heart sounds.  Pulmonary:     Effort: Pulmonary effort is normal. No respiratory distress.     Breath sounds: Normal breath sounds.  Abdominal:     General: Bowel sounds are normal. There is no distension.     Palpations: Abdomen is soft.     Tenderness: There is no abdominal tenderness. There is left CVA tenderness. There is no right CVA tenderness, guarding or rebound.     Comments: Mild left CVAT.   Musculoskeletal:        General: No swelling, tenderness, deformity or signs of injury. Normal range of motion.     Cervical back: Neck supple.  Skin:    General: Skin is warm and dry.     Capillary Refill: Capillary refill takes less than 2 seconds.     Findings: No bruising, erythema, lesion or rash.  Neurological:     General: No focal deficit present.     Mental Status: She is alert and oriented to person, place, and time.     Sensory: No sensory deficit.     Motor: No weakness.     Gait: Gait normal.  Psychiatric:        Mood and Affect: Mood normal.        Behavior: Behavior normal.      UC Treatments / Results  Labs (all labs ordered are listed, but only abnormal results are displayed) Labs Reviewed  POCT URINALYSIS DIP (MANUAL ENTRY) - Abnormal; Notable for the following components:      Result Value   Color, UA light  yellow (*)    Blood, UA moderate (*)    All other components within normal limits    EKG   Radiology No results found.  Procedures Procedures (including critical care time)  Medications Ordered in UC Medications - No data to  display  Initial Impression / Assessment and Plan / UC Course  I have reviewed the triage vital signs and the nursing notes.  Pertinent labs & imaging results that were available during my care of the patient were reviewed by me and considered in my medical decision making (see chart for details).    Left flank pain.  Afebrile and vital signs are stable.  Abdomen is soft and nontender with good bowel sounds.  Urine does not show signs of infection.  Instructed patient to follow-up with her gynecologist or PCP today.  ED precautions discussed.  Education provided on flank pain.  Patient agrees to plan of care.  Final Clinical Impressions(s) / UC Diagnoses   Final diagnoses:  Acute left flank pain     Discharge Instructions      Go to the emergency department if you have worsening symptoms.    Follow-up with your gynecologist or PCP today.    Your urine does not show signs of infection.     ED Prescriptions   None    PDMP not reviewed this encounter.   Sharion Balloon, NP 07/14/22 [phone]

## 2022-07-14 NOTE — Discharge Instructions (Addendum)
Go to the emergency department if you have worsening symptoms.    Follow-up with your gynecologist or PCP today.    Your urine does not show signs of infection.

## 2022-08-03 NOTE — L&D Delivery Note (Signed)
Delivery Note   Kristi Bauer is a 22 y.o. G3P0020 at [redacted]w[redacted]d Estimated Date of Delivery: 07/06/23  PRE-OPERATIVE DIAGNOSIS:  1) [redacted]w[redacted]d pregnancy.  2) Chronic Hypertension  POST-OPERATIVE DIAGNOSIS:  1) [redacted]w[redacted]d pregnancy s/p Vaginal, Spontaneous   Delivery Type: Vaginal, Spontaneous   Delivery Anesthesia: Epidural;Local  Labor Complications:  None    ESTIMATED BLOOD LOSS: 400 ml    FINDINGS:   1) female infant, Apgar scores of 8   at 1 minute and 9   at 5 minutes and a birthweight pending per protocol.     SPECIMENS:   PLACENTA:   Appearance: Intact   Removal: Spontaneous     Disposition:  Discarded  CORD BLOOD: Not collected  DISPOSITION:  Infant left in stable condition in the delivery room, with L&D personnel and mother,  NARRATIVE SUMMARY: Labor course:  [name] is a [id number] at [redacted]w[redacted]d who presented to Labor & Delivery for labor management, PROM. Her initial cervical exam was deferred until after epidural placement at patient request, she was found to be 4/100/0 at 0240. Labor proceeded spontaneously and she was found to be completely dilated at 0937. With excellent maternal pushing effort, she birthed a viable female infant at 85. Infant was born ROP. There was a nuchal cord which was loose and reduced after delivery. The shoulders were birthed without difficulty. The infant was placed skin-to-skin with mother. The cord was doubly clamped and cut when pulsations ceased. The placenta delivered spontaneously and was noted to be intact with a 3VC. A perineal and vaginal examination was performed. Labia & perineum edematous. Episiotomy/Lacerations: 2nd degree;Perineal Lacerations were repaired with Vicryl suture using local and continued epidural anesthesia. The patient tolerated this well. Mother and baby were left in stable condition.   [name], CNM 07/01/2023 1:44 PM

## 2022-08-04 ENCOUNTER — Other Ambulatory Visit: Payer: BC Managed Care – PPO

## 2022-08-11 ENCOUNTER — Encounter: Payer: BC Managed Care – PPO | Admitting: Obstetrics and Gynecology

## 2022-09-16 NOTE — Progress Notes (Unsigned)
New patient visit   Patient: Kristi Bauer   DOB: 03-Feb-2001   22 y.o. Female  MRN: [id number] Visit Date: 09/17/2022  Today's healthcare provider: Mikey Kirschner, PA-C   No chief complaint on file.  Subjective    Kristi Bauer is a 22 y.o. female who presents today as a new patient to establish care.  HPI  -Influenza Vaccine: -Hepatitis C Screening: -HIV Screening: -Chlamydia Screening:   Past Medical History:  Diagnosis Date   Miscarriage    Panic attacks    RSV (respiratory syncytial virus infection)    34 mo old, admitted at [facility]   No past surgical history on file. Family Status  Relation Name Status   Mother  (Not Specified)   Mat Aunt  (Not Specified)   Psychiatrist  (Not Specified)   MGM  (Not Specified)   PGM  (Not Specified)   PGF  (Not Specified)   Family History  Problem Relation Age of Onset   Hypertension Mother    Cancer Maternal Aunt        breast   Heart disease Maternal Aunt    Heart disease Paternal Uncle    Cancer Maternal Grandmother        breast   Heart disease Maternal Grandmother    Hypertension Maternal Grandmother    Heart disease Paternal Grandmother    Hypertension Paternal Grandmother    Cancer Paternal Grandfather        prostate   Social History   Socioeconomic History   Marital status: Single    Spouse name: Not on file   Number of children: Not on file   Years of education: Not on file   Highest education level: Not on file  Occupational History   Not on file  Tobacco Use   Smoking status: Never   Smokeless tobacco: Never  Vaping Use   Vaping Use: Never used  Substance and Sexual Activity   Alcohol use: No   Drug use: No   Sexual activity: Not Currently    Birth control/protection: None  Other Topics Concern   Not on file  Social History Narrative   Not on file   Social Determinants of Health   Financial Resource Strain: Not on file  Food Insecurity: Not on file  Transportation Needs: Not on file   Physical Activity: Not on file  Stress: Not on file  Social Connections: Not on file   Outpatient Medications Prior to Visit  Medication Sig   Prenatal Vit-Fe Fumarate-FA (PRENATAL PO) Take by mouth.   sertraline (ZOLOFT) 50 MG tablet TAKE 1 TABLET BY MOUTH EVERY DAY   No facility-administered medications prior to visit.   No Known Allergies   There is no immunization history on file for this patient.  Health Maintenance  Topic Date Due   COVID-19 Vaccine (1) Never done   HPV VACCINES (1 - 2-dose series) Never done   CHLAMYDIA SCREENING  Never done   HIV Screening  Never done   Hepatitis C Screening  Never done   DTaP/Tdap/Td (1 - Tdap) Never done   INFLUENZA VACCINE  Never done    Patient Care Team: Luna Fuse, MD as PCP - General (Pediatrics)  Review of Systems  {Labs  Heme  Chem  Endocrine  Serology  Results Review (optional):23779}   Objective    There were no vitals taken for this visit. {Show previous vital signs (optional):23777}  Physical Exam ***  Depression Screen    03/17/2022  2:41 PM 01/14/2022    1:38 PM 10/29/2021    9:51 AM  PHQ 2/9 Scores  PHQ - 2 Score 2 5 5  $ PHQ- 9 Score 9 19 11   $ No results found for any visits on 09/17/22.  Assessment & Plan     ***  No follow-ups on file.     {provider attestation***:1}   Mikey Kirschner, PA-C  [place] [phone] (phone) [phone] (fax)  [place]

## 2022-09-17 ENCOUNTER — Encounter: Payer: Self-pay | Admitting: Physician Assistant

## 2022-09-17 ENCOUNTER — Ambulatory Visit: Payer: BC Managed Care – PPO | Admitting: Physician Assistant

## 2022-09-17 VITALS — BP 125/82 | HR 77 | Temp 97.7°F | Ht 61.5 in | Wt 140.0 lb

## 2022-09-17 DIAGNOSIS — M545 Low back pain, unspecified: Secondary | ICD-10-CM | POA: Diagnosis not present

## 2022-09-17 DIAGNOSIS — O3680X0 Pregnancy with inconclusive fetal viability, not applicable or unspecified: Secondary | ICD-10-CM | POA: Diagnosis not present

## 2022-09-17 DIAGNOSIS — K921 Melena: Secondary | ICD-10-CM

## 2022-09-17 DIAGNOSIS — R109 Unspecified abdominal pain: Secondary | ICD-10-CM | POA: Insufficient documentation

## 2022-09-17 DIAGNOSIS — R319 Hematuria, unspecified: Secondary | ICD-10-CM

## 2022-09-17 DIAGNOSIS — F320 Major depressive disorder, single episode, mild: Secondary | ICD-10-CM

## 2022-09-17 DIAGNOSIS — Z6826 Body mass index (BMI) 26.0-26.9, adult: Secondary | ICD-10-CM | POA: Diagnosis not present

## 2022-09-17 DIAGNOSIS — R739 Hyperglycemia, unspecified: Secondary | ICD-10-CM | POA: Diagnosis not present

## 2022-09-17 DIAGNOSIS — E663 Overweight: Secondary | ICD-10-CM | POA: Diagnosis not present

## 2022-09-17 HISTORY — DX: Pregnancy with inconclusive fetal viability, not applicable or unspecified: O36.80X0

## 2022-09-17 HISTORY — DX: Melena: K92.1

## 2022-09-17 LAB — POCT URINALYSIS DIPSTICK
Bilirubin, UA: NEGATIVE
Glucose, UA: NEGATIVE
Ketones, UA: NEGATIVE
Leukocytes, UA: NEGATIVE
Nitrite, UA: NEGATIVE
Protein, UA: NEGATIVE
Spec Grav, UA: 1.02 (ref 1.010–1.025)
Urobilinogen, UA: 0.2 E.U./dL
pH, UA: 6 (ref 5.0–8.0)

## 2022-09-17 NOTE — Assessment & Plan Note (Signed)
Review of records, likely miscarriage with elevated hcg and no intrauterine pregnancy. Pt was supposed to follow up for serial hcg. Will repeat hcg today  Encouraged f/u with gyn Discussed possibility of pregnancy and contraception

## 2022-09-17 NOTE — Assessment & Plan Note (Signed)
Resolved for the last week Pt given an Oclite to check her stool at home and return to office No acid reflux symptoms Ref to GI for endoscopy

## 2022-09-17 NOTE — Assessment & Plan Note (Signed)
Pt managed on zoloft 50 mg from gyn

## 2022-09-17 NOTE — Assessment & Plan Note (Addendum)
UA today + trace blood Will send for culture, urine micro Based on hcg results may order repeat pelvic ultrasound Unsure if related to stool changes, but ref to GI

## 2022-09-18 ENCOUNTER — Other Ambulatory Visit: Payer: Self-pay | Admitting: Physician Assistant

## 2022-09-18 ENCOUNTER — Telehealth: Payer: Self-pay

## 2022-09-18 DIAGNOSIS — O3680X0 Pregnancy with inconclusive fetal viability, not applicable or unspecified: Secondary | ICD-10-CM

## 2022-09-18 LAB — URINALYSIS, MICROSCOPIC ONLY: Casts: NONE SEEN /lpf

## 2022-09-18 LAB — CBC WITH DIFFERENTIAL/PLATELET
Basophils Absolute: 0.1 10*3/uL (ref 0.0–0.2)
Basos: 1 %
EOS (ABSOLUTE): 0.2 10*3/uL (ref 0.0–0.4)
Eos: 2 %
Hematocrit: 39 % (ref 34.0–46.6)
Hemoglobin: 13.4 g/dL (ref 11.1–15.9)
Immature Grans (Abs): 0 10*3/uL (ref 0.0–0.1)
Immature Granulocytes: 0 %
Lymphocytes Absolute: 2.4 10*3/uL (ref 0.7–3.1)
Lymphs: 32 %
MCH: 30.2 pg (ref 26.6–33.0)
MCHC: 34.4 g/dL (ref 31.5–35.7)
MCV: 88 fL (ref 79–97)
Monocytes Absolute: 0.7 10*3/uL (ref 0.1–0.9)
Monocytes: 10 %
Neutrophils Absolute: 4.2 10*3/uL (ref 1.4–7.0)
Neutrophils: 55 %
Platelets: 228 10*3/uL (ref 150–450)
RBC: 4.44 x10E6/uL (ref 3.77–5.28)
RDW: 11.9 % (ref 11.7–15.4)
WBC: 7.5 10*3/uL (ref 3.4–10.8)

## 2022-09-18 LAB — HEMOGLOBIN A1C
Est. average glucose Bld gHb Est-mCnc: 103 mg/dL
Hgb A1c MFr Bld: 5.2 % (ref 4.8–5.6)

## 2022-09-18 NOTE — Telephone Encounter (Signed)
Patient reports she had a miscarriage and had an u/s done. And it showed a complete miscarriage. She does not want to do lab.

## 2022-09-18 NOTE — Telephone Encounter (Signed)
Patient advised. Will get lab done.

## 2022-09-21 DIAGNOSIS — O3680X0 Pregnancy with inconclusive fetal viability, not applicable or unspecified: Secondary | ICD-10-CM | POA: Diagnosis not present

## 2022-09-21 DIAGNOSIS — F331 Major depressive disorder, recurrent, moderate: Secondary | ICD-10-CM | POA: Diagnosis not present

## 2022-09-21 LAB — URINE CULTURE

## 2022-09-22 LAB — BETA HCG QUANT (REF LAB): hCG Quant: <1 m[IU]/mL

## 2022-10-05 DIAGNOSIS — F331 Major depressive disorder, recurrent, moderate: Secondary | ICD-10-CM | POA: Diagnosis not present

## 2022-10-08 LAB — SPECIMEN STATUS REPORT

## 2022-10-08 LAB — FECAL OCCULT BLOOD, IMMUNOCHEMICAL

## 2022-10-09 ENCOUNTER — Other Ambulatory Visit: Payer: Self-pay | Admitting: Obstetrics and Gynecology

## 2022-10-09 DIAGNOSIS — F418 Other specified anxiety disorders: Secondary | ICD-10-CM

## 2022-10-14 ENCOUNTER — Emergency Department: Payer: Worker's Compensation

## 2022-10-14 ENCOUNTER — Encounter: Payer: Self-pay | Admitting: Intensive Care

## 2022-10-14 ENCOUNTER — Other Ambulatory Visit: Payer: Self-pay

## 2022-10-14 ENCOUNTER — Emergency Department
Admission: EM | Admit: 2022-10-14 | Discharge: 2022-10-14 | Disposition: A | Discharge status: Home / Self Care | Payer: Worker's Compensation | Attending: Emergency Medicine | Admitting: Emergency Medicine

## 2022-10-14 DIAGNOSIS — Y990 Civilian activity done for income or pay: Secondary | ICD-10-CM | POA: Insufficient documentation

## 2022-10-14 DIAGNOSIS — S99912A Unspecified injury of left ankle, initial encounter: Secondary | ICD-10-CM | POA: Diagnosis present

## 2022-10-14 DIAGNOSIS — S93492A Sprain of other ligament of left ankle, initial encounter: Secondary | ICD-10-CM | POA: Diagnosis not present

## 2022-10-14 DIAGNOSIS — W108XXA Fall (on) (from) other stairs and steps, initial encounter: Secondary | ICD-10-CM | POA: Insufficient documentation

## 2022-10-14 DIAGNOSIS — S93432A Sprain of tibiofibular ligament of left ankle, initial encounter: Secondary | ICD-10-CM | POA: Diagnosis not present

## 2022-10-14 MED ORDER — IBUPROFEN 600 MG PO TABS: 600.0000 mg | ORAL_TABLET | Freq: Four times a day (QID) | ORAL | 0 refills | Status: DC | PRN

## 2022-10-14 NOTE — ED Triage Notes (Signed)
Patient injured left ankle while at work

## 2022-10-14 NOTE — Discharge Instructions (Signed)
Please rest ice and elevate the left ankle.  Use ankle brace while ambulating.  Use crutches as needed for ambulation until you can walk without a limp.  Alternate Tylenol and ibuprofen as needed for pain.  Return to the ER for any worsening symptoms or any urgent changes in your health

## 2022-10-14 NOTE — ED Provider Notes (Signed)
[facility] Provider Note   CSN: [id number] Arrival date & time: 10/14/22  1617     History  Chief Complaint  Patient presents with   Ankle Pain    Left     Kristi Bauer is a 22 y.o. female.  Presents to the emergency department for evaluation of left ankle pain.  Patient has had left ankle Pangle since earlier today, inverted rolled the left ankle while at work coming down a step.  She has pain and swelling to the lateral aspect of the ankle along the ATFL region.  No other injury to the body.  She is having a hard time ambulating.  HPI     Home Medications Prior to Admission medications   Medication Sig Start Date End Date Taking? Authorizing Provider  ibuprofen (ADVIL) 600 MG tablet Take 1 tablet (600 mg total) by mouth every 6 (six) hours as needed for moderate pain. 10/14/22  Yes Duanne Guess, PA-C  Prenatal Vit-Fe Fumarate-FA (PRENATAL PO) Take by mouth.    [provider]  sertraline (ZOLOFT) 50 MG tablet TAKE 1 TABLET BY MOUTH EVERY DAY 10/09/22   Harlin Heys, MD      Allergies    Patient has no known allergies.    Review of Systems   Review of Systems  Physical Exam Updated Vital Signs BP 139/77 (BP Location: Left Arm)   Pulse 89   Temp 98.4 F (36.9 C) (Oral)   Ht 5' 1.5" (1.562 m)   Wt 65.8 kg   LMP 09/29/2022 (Exact Date)   SpO2 100%   BMI 26.95 kg/m  Physical Exam Constitutional:      Appearance: She is well-developed.  HENT:     Head: Normocephalic and atraumatic.  Eyes:     Conjunctiva/sclera: Conjunctivae normal.  Cardiovascular:     Rate and Rhythm: Normal rate.  Pulmonary:     Effort: Pulmonary effort is normal. No respiratory distress.  Musculoskeletal:        General: Normal range of motion.     Cervical back: Normal range of motion.     Comments: Left ankle tender along the ATFL region no medial or lateral malleolus tenderness.  No tenderness along the metatarsals or  tarsals.  No skin breakdown noted.  No warmth or redness.  2+ dorsalis pedis pulses.  Nontender along the proximal tib-fib region.  Skin:    General: Skin is warm.     Findings: No rash.  Neurological:     General: No focal deficit present.     Mental Status: She is alert and oriented to person, place, and time. Mental status is at baseline.  Psychiatric:        Behavior: Behavior normal.        Thought Content: Thought content normal.     ED Results / Procedures / Treatments   Labs (all labs ordered are listed, but only abnormal results are displayed) Labs Reviewed - No data to display  EKG None  Radiology DG Ankle Complete Left  Result Date: 10/14/2022 CLINICAL DATA:  Fall.  Lateral swelling. EXAM: LEFT ANKLE COMPLETE - 3+ VIEW COMPARISON:  None Available. FINDINGS: Normal bone mineralization. The ankle mortise is symmetric and intact. Joint space is preserved. No acute fracture or dislocation. IMPRESSION: Normal left ankle radiographs. Electronically Signed   By: Yvonne Kendall M.D.   On: 10/14/2022 17:38    Procedures Procedures    Medications Ordered in ED Medications - No data  to display  ED Course/ Medical Decision Making/ A&P                             Medical Decision Making Amount and/or Complexity of Data Reviewed Radiology: ordered.  Risk Prescription drug management.   22 year old female with left lateral ankle sprain.  X-rays of the left ankle ordered and independently reviewed by me today show no evidence of acute bony abnormality or syndesmotic widening.  History and exam consistent with ATFL ankle sprain, she is placed into ASO brace and will rest ice and elevate and use crutches as needed for ambulation.  She will follow-up with orthopedics in 1 week if no improvement.  She will take Tylenol and Profen as needed for pain. Final Clinical Impression(s) / ED Diagnoses Final diagnoses:  Sprain of anterior talofibular ligament of left ankle, initial  encounter    Rx / DC Orders ED Discharge Orders          Ordered    ibuprofen (ADVIL) 600 MG tablet  Every 6 hours PRN        10/14/22 1930              [name] 10/14/22 1934    Blake Divine, MD 10/14/22 2303

## 2022-10-16 ENCOUNTER — Ambulatory Visit: Payer: BC Managed Care – PPO | Admitting: Physician Assistant

## 2022-11-04 NOTE — Progress Notes (Signed)
    NURSE VISIT NOTE  Subjective:    Patient ID: Kristi Bauer, female    DOB: 2000/09/21, 21 y.o.   MRN: [id number]  HPI  Patient is a 22 y.o. [id number] female who presents for evaluation of amenorrhea. She believes she could be pregnant. Pregnancy is desired. Sexual Activity: single partner, contraception: none. Current symptoms also include: breast tenderness, fatigue, frequent urination, morning sickness, nausea, and positive home pregnancy test. Last period was normal.    Objective:    LMP 09/29/2022 (Exact Date)   Lab Review  Results for orders placed or performed in visit on 11/05/22  POCT urine pregnancy  Result Value Ref Range   Preg Test, Ur Positive (A) Negative    Assessment:   1. Possible pregnancy   2. Amenorrhea     Plan:   Pregnancy Test: Positive  Estimated Date of Delivery: None noted. Encouraged well-balanced diet, plenty of rest when needed, pre-natal vitamins daily and walking for exercise.  Discussed self-help for nausea, avoiding OTC medications until consulting provider or pharmacist, other than Tylenol as needed, minimal caffeine (1-2 cups daily) and avoiding alcohol.   She will schedule her nurse visit @ [redacted] wks pregnant, u/s for dating and labs @10  wk, and NOB visit at [redacted] wk pregnant.    Feel free to call with any questions.     Chilton Greathouse, [facility]

## 2022-11-05 ENCOUNTER — Ambulatory Visit (INDEPENDENT_AMBULATORY_CARE_PROVIDER_SITE_OTHER): Payer: BC Managed Care – PPO

## 2022-11-05 VITALS — BP 121/54 | HR 92 | Resp 16 | Ht 61.5 in | Wt 142.8 lb

## 2022-11-05 DIAGNOSIS — Z3201 Encounter for pregnancy test, result positive: Secondary | ICD-10-CM | POA: Diagnosis not present

## 2022-11-05 DIAGNOSIS — Z3200 Encounter for pregnancy test, result unknown: Secondary | ICD-10-CM

## 2022-11-05 DIAGNOSIS — N912 Amenorrhea, unspecified: Secondary | ICD-10-CM

## 2022-11-05 LAB — POCT URINE PREGNANCY: Preg Test, Ur: POSITIVE — AB

## 2022-11-05 NOTE — Patient Instructions (Signed)
Commonly Asked Questions During Pregnancy  Cats: A parasite can be excreted in cat feces.  To avoid exposure you need to have another person empty the little box.  If you must empty the litter box you will need to wear gloves.  Wash your hands after handling your cat.  This parasite can also be found in raw or undercooked meat so this should also be avoided.  Colds, Sore Throats, Flu: Please check your medication sheet to see what you can take for symptoms.  If your symptoms are unrelieved by these medications please call the office.  Dental Work: Most any dental work your dentist recommends is permitted.  X-rays should only be taken during the first trimester if absolutely necessary.  Your abdomen should be shielded with a lead apron during all x-rays.  Please notify your provider prior to receiving any x-rays.  Novocaine is fine; gas is not recommended.  If your dentist requires a note from us prior to dental work please call the office and we will provide one for you.  Exercise: Exercise is an important part of staying healthy during your pregnancy.  You may continue most exercises you were accustomed to prior to pregnancy.  Later in your pregnancy you will most likely notice you have difficulty with activities requiring balance like riding a bicycle.  It is important that you listen to your body and avoid activities that put you at a higher risk of falling.  Adequate rest and staying well hydrated are a must!  If you have questions about the safety of specific activities ask your provider.    Exposure to Children with illness: Try to avoid obvious exposure; report any symptoms to us when noted,  If you have chicken pos, red measles or mumps, you should be immune to these diseases.   Please do not take any vaccines while pregnant unless you have checked with your OB provider.  Fetal Movement: After 28 weeks we recommend you do "kick counts" twice daily.  Lie or sit down in a calm quiet environment and  count your baby movements "kicks".  You should feel your baby at least 10 times per hour.  If you have not felt 10 kicks within the first hour get up, walk around and have something sweet to eat or drink then repeat for an additional hour.  If count remains less than 10 per hour notify your provider.  Fumigating: Follow your pest control agent's advice as to how long to stay out of your home.  Ventilate the area well before re-entering.  Hemorrhoids:   Most over-the-counter preparations can be used during pregnancy.  Check your medication to see what is safe to use.  It is important to use a stool softener or fiber in your diet and to drink lots of liquids.  If hemorrhoids seem to be getting worse please call the office.   Hot Tubs:  Hot tubs Jacuzzis and saunas are not recommended while pregnant.  These increase your internal body temperature and should be avoided.  Intercourse:  Sexual intercourse is safe during pregnancy as long as you are comfortable, unless otherwise advised by your provider.  Spotting may occur after intercourse; report any bright red bleeding that is heavier than spotting.  Labor:  If you know that you are in labor, please go to the hospital.  If you are unsure, please call the office and let us help you decide what to do.  Lifting, straining, etc:  If your job requires heavy   lifting or straining please check with your provider for any limitations.  Generally, you should not lift items heavier than that you can lift simply with your hands and arms (no back muscles)  Painting:  Paint fumes do not harm your pregnancy, but may make you ill and should be avoided if possible.  Latex or water based paints have less odor than oils.  Use adequate ventilation while painting.  Permanents & Hair Color:  Chemicals in hair dyes are not recommended as they cause increase hair dryness which can increase hair loss during pregnancy.  " Highlighting" and permanents are allowed.  Dye may be  absorbed differently and permanents may not hold as well during pregnancy.  Sunbathing:  Use a sunscreen, as skin burns easily during pregnancy.  Drink plenty of fluids; avoid over heating.  Tanning Beds:  Because their possible side effects are still unknown, tanning beds are not recommended.  Ultrasound Scans:  Routine ultrasounds are performed at approximately 20 weeks.  You will be able to see your baby's general anatomy an if you would like to know the gender this can usually be determined as well.  If it is questionable when you conceived you may also receive an ultrasound early in your pregnancy for dating purposes.  Otherwise ultrasound exams are not routinely performed unless there is a medical necessity.  Although you can request a scan we ask that you pay for it when conducted because insurance does not cover " patient request" scans.  Work: If your pregnancy proceeds without complications you may work until your due date, unless your physician or employer advises otherwise.  Round Ligament Pain/Pelvic Discomfort:  Sharp, shooting pains not associated with bleeding are fairly common, usually occurring in the second trimester of pregnancy.  They tend to be worse when standing up or when you remain standing for long periods of time.  These are the result of pressure of certain pelvic ligaments called "round ligaments".  Rest, Tylenol and heat seem to be the most effective relief.  As the womb and fetus grow, they rise out of the pelvis and the discomfort improves.  Please notify the office if your pain seems different than that described.  It may represent a more serious condition.  Common Medications Safe in Pregnancy  Acne:      Constipation:  Benzoyl Peroxide     Colace  Clindamycin      Dulcolax Suppository  Topica Erythromycin     Fibercon  Salicylic Acid      Metamucil         Miralax AVOID:        Senakot   Accutane    Cough:  Retin-A       Cough  Drops  Tetracycline      Phenergan w/ Codeine if Rx  Minocycline      Robitussin (Plain & DM)  Antibiotics:     Crabs/Lice:  Ceclor       RID  Cephalosporins    AVOID:  E-Mycins      Kwell  Keflex  Macrobid/Macrodantin   Diarrhea:  Penicillin      Kao-Pectate  Zithromax      Imodium AD         PUSH FLUIDS AVOID:       Cipro     Fever:  Tetracycline      Tylenol (Regular or Extra  Minocycline       Strength)  Levaquin      Extra Strength-Do not  Exceed 8 tabs/24 hrs Caffeine:        <200mg/day (equiv. To 1 cup of coffee or  approx. 3 12 oz sodas)         Gas: Cold/Hayfever:       Gas-X  Benadryl      Mylicon  Claritin       Phazyme  **Claritin-D        Chlor-Trimeton    Headaches:  Dimetapp      ASA-Free Excedrin  Drixoral-Non-Drowsy     Cold Compress  Mucinex (Guaifenasin)     Tylenol (Regular or Extra  Sudafed/Sudafed-12 Hour     Strength)  **Sudafed PE Pseudoephedrine   Tylenol Cold & Sinus     Vicks Vapor Rub  Zyrtec  **AVOID if Problems With Blood Pressure         Heartburn: Avoid lying down for at least 1 hour after meals  Aciphex      Maalox     Rash:  Milk of Magnesia     Benadryl    Mylanta       1% Hydrocortisone Cream  Pepcid  Pepcid Complete   Sleep Aids:  Prevacid      Ambien   Prilosec       Benadryl  Rolaids       Chamomile Tea  Tums (Limit 4/day)     Unisom         Tylenol PM         Warm milk-add vanilla or  Hemorrhoids:       Sugar for taste  Anusol/Anusol H.C.  (RX: Analapram 2.5%)  Sugar Substitutes:  Hydrocortisone OTC     Ok in moderation  Preparation H      Tucks        Vaseline lotion applied to tissue with wiping    Herpes:     Throat:  Acyclovir      Oragel  Famvir  Valtrex     Vaccines:         Flu Shot Leg Cramps:       *Gardasil  Benadryl      Hepatitis A         Hepatitis B Nasal Spray:       Pneumovax  Saline Nasal Spray     Polio Booster         Tetanus Nausea:       Tuberculosis test or PPD  Vitamin  B6 25 mg TID   AVOID:    Dramamine      *Gardasil  Emetrol       Live Poliovirus  Ginger Root 250 mg QID    MMR (measles, mumps &  High Complex Carbs @ Bedtime    rebella)  Sea Bands-Accupressure    Varicella (Chickenpox)  Unisom 1/2 tab TID     *No known complications           If received before Pain:         Known pregnancy;   Darvocet       Resume series after  Lortab        Delivery  Percocet    Yeast:   Tramadol      Femstat  Tylenol 3      Gyne-lotrimin  Ultram       Monistat  Vicodin           MISC:         All Sunscreens             Hair Coloring/highlights          Insect Repellant's          (Including DEET)         Mystic Tans   First Trimester of Pregnancy  The first trimester of pregnancy starts on the first day of your last menstrual period until the end of week 12. This is also called months 1 through 3 of pregnancy. Body changes during your first trimester Your body goes through many changes during pregnancy. The changes usually return to normal after your baby is born. Physical changes You may gain or lose weight. Your breasts may grow larger and hurt. The area around your nipples may get darker. Dark spots or blotches may develop on your face. You may have changes in your hair. Health changes You may feel like you might vomit (nauseous), and you may vomit. You may have heartburn. You may have headaches. You may have trouble pooping (constipation). Your gums may bleed. Other changes You may get tired easily. You may pee (urinate) more often. Your menstrual periods will stop. You may not feel hungry. You may want to eat certain kinds of food. You may have changes in your emotions from day to day. You may have more dreams. Follow these instructions at home: Medicines Take over-the-counter and prescription medicines only as told by your doctor. Some medicines are not safe during pregnancy. Take a prenatal vitamin that contains at least 600 micrograms  (mcg) of folic acid. Eating and drinking Eat healthy meals that include: Fresh fruits and vegetables. Whole grains. Good sources of protein, such as meat, eggs, or tofu. Low-fat dairy products. Avoid raw meat and unpasteurized juice, milk, and cheese. If you feel like you may vomit, or you vomit: Eat 4 or 5 small meals a day instead of 3 large meals. Try eating a few soda crackers. Drink liquids between meals instead of during meals. You may need to take these actions to prevent or treat trouble pooping: Drink enough fluids to keep your pee (urine) pale yellow. Eat foods that are high in fiber. These include beans, whole grains, and fresh fruits and vegetables. Limit foods that are high in fat and sugar. These include fried or sweet foods. Activity Exercise only as told by your doctor. Most people can do their usual exercise routine during pregnancy. Stop exercising if you have cramps or pain in your lower belly (abdomen) or low back. Do not exercise if it is too hot or too humid, or if you are in a place of great height (high altitude). Avoid heavy lifting. If you choose to, you may have sex unless your doctor tells you not to. Relieving pain and discomfort Wear a good support bra if your breasts are sore. Rest with your legs raised (elevated) if you have leg cramps or low back pain. If you have bulging veins (varicose veins) in your legs: Wear support hose as told by your doctor. Raise your feet for 15 minutes, 3-4 times a day. Limit salt in your food. Safety Wear your seat belt at all times when you are in a car. Talk with your doctor if someone is hurting you or yelling at you. Talk with your doctor if you are feeling sad or have thoughts of hurting yourself. Lifestyle Do not use hot tubs, steam rooms, or saunas. Do not douche. Do not use tampons or scented sanitary pads. Do not use herbal medicines, illegal drugs, or medicines that are not approved by your doctor. Do  not  drink alcohol. Do not smoke or use any products that contain nicotine or tobacco. If you need help quitting, ask your doctor. Avoid cat litter boxes and soil that is used by cats. These carry germs that can cause harm to the baby and can cause a loss of your baby by miscarriage or stillbirth. General instructions Keep all follow-up visits. This is important. Ask for help if you need counseling or if you need help with nutrition. Your doctor can give you advice or tell you where to go for help. Visit your dentist. At home, brush your teeth with a soft toothbrush. Floss gently. Write down your questions. Take them to your prenatal visits. Where to find more information American Pregnancy Association: americanpregnancy.org [employer]: www.acog.org Office on Women's Health: KeywordPortfolios.com.br Contact a doctor if: You are dizzy. You have a fever. You have mild cramps or pressure in your lower belly. You have a nagging pain in your belly area. You continue to feel like you may vomit, you vomit, or you have watery poop (diarrhea) for 24 hours or longer. You have a bad-smelling fluid coming from your vagina. You have pain when you pee. You are exposed to a disease that spreads from person to person, such as chickenpox, measles, Zika virus, HIV, or hepatitis. Get help right away if: You have spotting or bleeding from your vagina. You have very bad belly cramping or pain. You have shortness of breath or chest pain. You have any kind of injury, such as from a fall or a car crash. You have new or increased pain, swelling, or redness in an arm or leg. Summary The first trimester of pregnancy starts on the first day of your last menstrual period until the end of week 12 (months 1 through 3). Eat 4 or 5 small meals a day instead of 3 large meals. Do not smoke or use any products that contain nicotine or tobacco. If you need help quitting, ask your  doctor. Keep all follow-up visits. This information is not intended to replace advice given to you by your health care provider. Make sure you discuss any questions you have with your health care provider. Document Revised: 12/27/2019 Document Reviewed: 11/02/2019 Elsevier Patient Education  [place].   Morning Sickness  Morning sickness is when you feel like you may vomit (feel nauseous) during pregnancy. Sometimes, you may vomit. Morning sickness most often happens in the morning, but it can also happen at any time of the day. Some women may have morning sickness that makes them vomit all the time. This is a more serious problem that needs treatment. What are the causes? The cause of this condition is not known. What increases the risk? You had vomiting or a feeling like you may vomit before your pregnancy. You had morning sickness in another pregnancy. You are pregnant with more than one baby, such as twins. What are the signs or symptoms? Feeling like you may vomit. Vomiting. How is this treated? Treatment is usually not needed for this condition. You may only need to change what you eat. In some cases, your doctor may give you some things to take for your condition. These include: Vitamin B6 supplements. Medicines to treat the feeling that you may vomit. Ginger. Follow these instructions at home: Medicines Take over-the-counter and prescription medicines only as told by your doctor. Do not take any medicines until you talk with your doctor about them first. Take multivitamins before you get  pregnant. These can stop or lessen the symptoms of morning sickness. Eating and drinking Eat dry toast or crackers before getting out of bed. Eat 5 or 6 small meals a day. Eat dry and bland foods like rice and baked potatoes. Do not eat greasy, fatty, or spicy foods. Have someone cook for you if the smell of food causes you to vomit or to feel like you may vomit. If you feel like  you may vomit after taking prenatal vitamins, take them at night or with a snack. Eat protein foods when you need a snack. Nuts, yogurt, and cheese are good choices. Drink fluids throughout the day. Try ginger ale made with real ginger, ginger tea made from fresh grated ginger, or ginger candies. General instructions Do not smoke or use any products that contain nicotine or tobacco. If you need help quitting, ask your doctor. Use an air purifier to keep the air in your house free of smells. Get lots of fresh air. Try to avoid smells that make you feel sick. Try wearing an acupressure wristband. This is a wristband that is used to treat seasickness. Try a treatment called acupuncture. In this treatment, a doctor puts needles into certain areas of your body to make you feel better. Contact a doctor if: You need medicine to feel better. You feel dizzy or light-headed. You are losing weight. Get help right away if: The feeling that you may vomit will not go away, or you cannot stop vomiting. You faint. You have very bad pain in your belly. Summary Morning sickness is when you feel like you may vomit (feel nauseous) during pregnancy. You may feel sick in the morning, but you can feel this way at any time of the day. Making some changes to what you eat may help your symptoms go away. This information is not intended to replace advice given to you by your health care provider. Make sure you discuss any questions you have with your health care provider. Document Revised: 03/04/2020 Document Reviewed: 02/12/2020 Elsevier Patient Education  [place].

## 2022-11-16 ENCOUNTER — Telehealth: Payer: Self-pay

## 2022-11-16 ENCOUNTER — Other Ambulatory Visit: Payer: Self-pay

## 2022-11-16 DIAGNOSIS — Z8759 Personal history of other complications of pregnancy, childbirth and the puerperium: Secondary | ICD-10-CM

## 2022-11-16 NOTE — Telephone Encounter (Signed)
Patient called in to state vaginal spotting over the past 3 days. She is currently estimated to be [redacted]w[redacted]d pregnant. Patient has a history of miscarriages and is very concerned. Denied cramping, pain or heavy bleeding. Patient currently scheduled for an ultrasound in may, sooner appt offered for tomorrow, patient gladly accepted early scan with her history. Advised to call back with any additional or worsening symptoms.

## 2022-11-17 ENCOUNTER — Ambulatory Visit (INDEPENDENT_AMBULATORY_CARE_PROVIDER_SITE_OTHER): Payer: BC Managed Care – PPO

## 2022-11-17 DIAGNOSIS — Z3A01 Less than 8 weeks gestation of pregnancy: Secondary | ICD-10-CM

## 2022-11-17 DIAGNOSIS — Z8759 Personal history of other complications of pregnancy, childbirth and the puerperium: Secondary | ICD-10-CM

## 2022-11-17 DIAGNOSIS — O3680X0 Pregnancy with inconclusive fetal viability, not applicable or unspecified: Secondary | ICD-10-CM | POA: Diagnosis not present

## 2022-11-26 ENCOUNTER — Ambulatory Visit (INDEPENDENT_AMBULATORY_CARE_PROVIDER_SITE_OTHER): Payer: BC Managed Care – PPO

## 2022-11-26 ENCOUNTER — Telehealth: Payer: Self-pay | Admitting: Obstetrics and Gynecology

## 2022-11-26 VITALS — Wt 142.0 lb

## 2022-11-26 DIAGNOSIS — Z130 Encounter for screening for diseases of the blood and blood-forming organs and certain disorders involving the immune mechanism: Secondary | ICD-10-CM

## 2022-11-26 DIAGNOSIS — Z3480 Encounter for supervision of other normal pregnancy, unspecified trimester: Secondary | ICD-10-CM

## 2022-11-26 DIAGNOSIS — Z369 Encounter for antenatal screening, unspecified: Secondary | ICD-10-CM

## 2022-11-26 DIAGNOSIS — Z3689 Encounter for other specified antenatal screening: Secondary | ICD-10-CM

## 2022-11-26 HISTORY — DX: Encounter for supervision of other normal pregnancy, unspecified trimester: Z34.80

## 2022-11-26 NOTE — Progress Notes (Signed)
New OB Intake  I connected with  Kristi Bauer on 11/26/22 at  2:15 PM EDT by telephone and verified that I am speaking with the correct person using two identifiers. Nurse is located at [facility] and pt is located at work.  I explained I am completing New OB Intake today. We discussed her EDD of 07/06/2023 that is based on LMP of 09/29/2022. Pt is G3/P0020. I reviewed her allergies, medications, Medical/Surgical/OB history, and appropriate screenings. There are no cats in the home.  Based on history, this is a/an pregnancy uncomplicated .   Patient Active Problem List   Diagnosis Date Noted   Supervision of other normal pregnancy, antepartum 11/26/2022   Depression, major, single episode, mild 09/17/2022   Flank pain 09/17/2022   Pregnancy, location unknown 09/17/2022   Black stool 09/17/2022   Altered mental status 01/30/2014    Concerns addressed today Pt currently has a sprained ankle and will start therapy soon.  Delivery Plans:  Plans to deliver at [facility].  Anatomy US Explained first scheduled [place] was done 11/17/22 and an anatomy scan will be done at 20 weeks.  Labs Discussed genetic screening with patient. Patient desires genetic testing to be drawn with new OB labs. Discussed possible labs to be drawn at new OB appointment.  COVID Vaccine Patient has had COVID vaccine.   Social Determinants of Health Food Insecurity: denies food insecurity Transportation: Patient denies transportation needs. Childcare: Discussed no children allowed at ultrasound appointments.   First visit review I reviewed new OB appt with pt. I explained she will have ob bloodwork and pap smear/pelvic exam if indicated. Explained pt will be seen by Tresea Mall, CNM at first visit; encounter routed to appropriate provider.   Kristi Bauer, [facility] 11/26/2022  2:56 PM

## 2022-11-26 NOTE — Telephone Encounter (Signed)
Reached out to pt to schedule NOB appt.  No answer, left message for pt to call back to schedule appt.

## 2022-11-26 NOTE — Telephone Encounter (Signed)
The patient called back and scheduled for 5/21 at 9:55 am with JEG

## 2022-11-30 NOTE — Patient Instructions (Signed)
First Trimester of Pregnancy  The first trimester of pregnancy starts on the first day of your last menstrual period until the end of week 12. This is also called months 1 through 3 of pregnancy. Body changes during your first trimester Your body goes through many changes during pregnancy. The changes usually return to normal after your baby is born. Physical changes You may gain or lose weight. Your breasts may grow larger and hurt. The area around your nipples may get darker. Dark spots or blotches may develop on your face. You may have changes in your hair. Health changes You may feel like you might vomit (nauseous), and you may vomit. You may have heartburn. You may have headaches. You may have trouble pooping (constipation). Your gums may bleed. Other changes You may get tired easily. You may pee (urinate) more often. Your menstrual periods will stop. You may not feel hungry. You may want to eat certain kinds of food. You may have changes in your emotions from day to day. You may have more dreams. Follow these instructions at home: Medicines Take over-the-counter and prescription medicines only as told by your doctor. Some medicines are not safe during pregnancy. Take a prenatal vitamin that contains at least 600 micrograms (mcg) of folic acid. Eating and drinking Eat healthy meals that include: Fresh fruits and vegetables. Whole grains. Good sources of protein, such as meat, eggs, or tofu. Low-fat dairy products. Avoid raw meat and unpasteurized juice, milk, and cheese. If you feel like you may vomit, or you vomit: Eat 4 or 5 small meals a day instead of 3 large meals. Try eating a few soda crackers. Drink liquids between meals instead of during meals. You may need to take these actions to prevent or treat trouble pooping: Drink enough fluids to keep your pee (urine) pale yellow. Eat foods that are high in fiber. These include beans, whole grains, and fresh fruits and  vegetables. Limit foods that are high in fat and sugar. These include fried or sweet foods. Activity Exercise only as told by your doctor. Most people can do their usual exercise routine during pregnancy. Stop exercising if you have cramps or pain in your lower belly (abdomen) or low back. Do not exercise if it is too hot or too humid, or if you are in a place of great height (high altitude). Avoid heavy lifting. If you choose to, you may have sex unless your doctor tells you not to. Relieving pain and discomfort Wear a good support bra if your breasts are sore. Rest with your legs raised (elevated) if you have leg cramps or low back pain. If you have bulging veins (varicose veins) in your legs: Wear support hose as told by your doctor. Raise your feet for 15 minutes, 3-4 times a day. Limit salt in your food. Safety Wear your seat belt at all times when you are in a car. Talk with your doctor if someone is hurting you or yelling at you. Talk with your doctor if you are feeling sad or have thoughts of hurting yourself. Lifestyle Do not use hot tubs, steam rooms, or saunas. Do not douche. Do not use tampons or scented sanitary pads. Do not use herbal medicines, illegal drugs, or medicines that are not approved by your doctor. Do not drink alcohol. Do not smoke or use any products that contain nicotine or tobacco. If you need help quitting, ask your doctor. Avoid cat litter boxes and soil that is used by cats. These carry   germs that can cause harm to the baby and can cause a loss of your baby by miscarriage or stillbirth. General instructions Keep all follow-up visits. This is important. Ask for help if you need counseling or if you need help with nutrition. Your doctor can give you advice or tell you where to go for help. Visit your dentist. At home, brush your teeth with a soft toothbrush. Floss gently. Write down your questions. Take them to your prenatal visits. Where to find more  information American Pregnancy Association: americanpregnancy.org American College of Obstetricians and Gynecologists: www.acog.org Office on Women's Health: womenshealth.gov/pregnancy Contact a doctor if: You are dizzy. You have a fever. You have mild cramps or pressure in your lower belly. You have a nagging pain in your belly area. You continue to feel like you may vomit, you vomit, or you have watery poop (diarrhea) for 24 hours or longer. You have a bad-smelling fluid coming from your vagina. You have pain when you pee. You are exposed to a disease that spreads from person to person, such as chickenpox, measles, Zika virus, HIV, or hepatitis. Get help right away if: You have spotting or bleeding from your vagina. You have very bad belly cramping or pain. You have shortness of breath or chest pain. You have any kind of injury, such as from a fall or a car crash. You have new or increased pain, swelling, or redness in an arm or leg. Summary The first trimester of pregnancy starts on the first day of your last menstrual period until the end of week 12 (months 1 through 3). Eat 4 or 5 small meals a day instead of 3 large meals. Do not smoke or use any products that contain nicotine or tobacco. If you need help quitting, ask your doctor. Keep all follow-up visits. This information is not intended to replace advice given to you by your health care provider. Make sure you discuss any questions you have with your health care provider. Document Revised: 12/27/2019 Document Reviewed: 11/02/2019 Elsevier Patient Education  2023 Elsevier Inc. Commonly Asked Questions During Pregnancy  Cats: A parasite can be excreted in cat feces.  To avoid exposure you need to have another person empty the little box.  If you must empty the litter box you will need to wear gloves.  Wash your hands after handling your cat.  This parasite can also be found in raw or undercooked meat so this should also be  avoided.  Colds, Sore Throats, Flu: Please check your medication sheet to see what you can take for symptoms.  If your symptoms are unrelieved by these medications please call the office.  Dental Work: Most any dental work your dentist recommends is permitted.  X-rays should only be taken during the first trimester if absolutely necessary.  Your abdomen should be shielded with a lead apron during all x-rays.  Please notify your provider prior to receiving any x-rays.  Novocaine is fine; gas is not recommended.  If your dentist requires a note from us prior to dental work please call the office and we will provide one for you.  Exercise: Exercise is an important part of staying healthy during your pregnancy.  You may continue most exercises you were accustomed to prior to pregnancy.  Later in your pregnancy you will most likely notice you have difficulty with activities requiring balance like riding a bicycle.  It is important that you listen to your body and avoid activities that put you at a higher   risk of falling.  Adequate rest and staying well hydrated are a must!  If you have questions about the safety of specific activities ask your provider.    Exposure to Children with illness: Try to avoid obvious exposure; report any symptoms to us when noted,  If you have chicken pos, red measles or mumps, you should be immune to these diseases.   Please do not take any vaccines while pregnant unless you have checked with your OB provider.  Fetal Movement: After 28 weeks we recommend you do "kick counts" twice daily.  Lie or sit down in a calm quiet environment and count your baby movements "kicks".  You should feel your baby at least 10 times per hour.  If you have not felt 10 kicks within the first hour get up, walk around and have something sweet to eat or drink then repeat for an additional hour.  If count remains less than 10 per hour notify your provider.  Fumigating: Follow your pest control agent's  advice as to how long to stay out of your home.  Ventilate the area well before re-entering.  Hemorrhoids:   Most over-the-counter preparations can be used during pregnancy.  Check your medication to see what is safe to use.  It is important to use a stool softener or fiber in your diet and to drink lots of liquids.  If hemorrhoids seem to be getting worse please call the office.   Hot Tubs:  Hot tubs Jacuzzis and saunas are not recommended while pregnant.  These increase your internal body temperature and should be avoided.  Intercourse:  Sexual intercourse is safe during pregnancy as long as you are comfortable, unless otherwise advised by your provider.  Spotting may occur after intercourse; report any bright red bleeding that is heavier than spotting.  Labor:  If you know that you are in labor, please go to the hospital.  If you are unsure, please call the office and let us help you decide what to do.  Lifting, straining, etc:  If your job requires heavy lifting or straining please check with your provider for any limitations.  Generally, you should not lift items heavier than that you can lift simply with your hands and arms (no back muscles)  Painting:  Paint fumes do not harm your pregnancy, but may make you ill and should be avoided if possible.  Latex or water based paints have less odor than oils.  Use adequate ventilation while painting.  Permanents & Hair Color:  Chemicals in hair dyes are not recommended as they cause increase hair dryness which can increase hair loss during pregnancy.  " Highlighting" and permanents are allowed.  Dye may be absorbed differently and permanents may not hold as well during pregnancy.  Sunbathing:  Use a sunscreen, as skin burns easily during pregnancy.  Drink plenty of fluids; avoid over heating.  Tanning Beds:  Because their possible side effects are still unknown, tanning beds are not recommended.  Ultrasound Scans:  Routine ultrasounds are performed  at approximately 20 weeks.  You will be able to see your baby's general anatomy an if you would like to know the gender this can usually be determined as well.  If it is questionable when you conceived you may also receive an ultrasound early in your pregnancy for dating purposes.  Otherwise ultrasound exams are not routinely performed unless there is a medical necessity.  Although you can request a scan we ask that you pay for it when   conducted because insurance does not cover " patient request" scans.  Work: If your pregnancy proceeds without complications you may work until your due date, unless your physician or employer advises otherwise.  Round Ligament Pain/Pelvic Discomfort:  Sharp, shooting pains not associated with bleeding are fairly common, usually occurring in the second trimester of pregnancy.  They tend to be worse when standing up or when you remain standing for long periods of time.  These are the result of pressure of certain pelvic ligaments called "round ligaments".  Rest, Tylenol and heat seem to be the most effective relief.  As the womb and fetus grow, they rise out of the pelvis and the discomfort improves.  Please notify the office if your pain seems different than that described.  It may represent a more serious condition.  Common Medications Safe in Pregnancy  Acne:      Constipation:  Benzoyl Peroxide     Colace  Clindamycin      Dulcolax Suppository  Topica Erythromycin     Fibercon  Salicylic Acid      Metamucil         Miralax AVOID:        Senakot   Accutane    Cough:  Retin-A       Cough Drops  Tetracycline      Phenergan w/ Codeine if Rx  Minocycline      Robitussin (Plain & DM)  Antibiotics:     Crabs/Lice:  Ceclor       RID  Cephalosporins    AVOID:  E-Mycins      Kwell  Keflex  Macrobid/Macrodantin   Diarrhea:  Penicillin      Kao-Pectate  Zithromax      Imodium AD         PUSH FLUIDS AVOID:       Cipro     Fever:  Tetracycline      Tylenol (Regular  or Extra  Minocycline       Strength)  Levaquin      Extra Strength-Do not          Exceed 8 tabs/24 hrs Caffeine:        <200mg/day (equiv. To 1 cup of coffee or  approx. 3 12 oz sodas)         Gas: Cold/Hayfever:       Gas-X  Benadryl      Mylicon  Claritin       Phazyme  **Claritin-D        Chlor-Trimeton    Headaches:  Dimetapp      ASA-Free Excedrin  Drixoral-Non-Drowsy     Cold Compress  Mucinex (Guaifenasin)     Tylenol (Regular or Extra  Sudafed/Sudafed-12 Hour     Strength)  **Sudafed PE Pseudoephedrine   Tylenol Cold & Sinus     Vicks Vapor Rub  Zyrtec  **AVOID if Problems With Blood Pressure         Heartburn: Avoid lying down for at least 1 hour after meals  Aciphex      Maalox     Rash:  Milk of Magnesia     Benadryl    Mylanta       1% Hydrocortisone Cream  Pepcid  Pepcid Complete   Sleep Aids:  Prevacid      Ambien   Prilosec       Benadryl  Rolaids       Chamomile Tea  Tums (Limit 4/day)     Unisom           Tylenol PM         Warm milk-add vanilla or  Hemorrhoids:       Sugar for taste  Anusol/Anusol H.C.  (RX: Analapram 2.5%)  Sugar Substitutes:  Hydrocortisone OTC     Ok in moderation  Preparation H      Tucks        Vaseline lotion applied to tissue with wiping    Herpes:     Throat:  Acyclovir      Oragel  Famvir  Valtrex     Vaccines:         Flu Shot Leg Cramps:       *Gardasil  Benadryl      Hepatitis A         Hepatitis B Nasal Spray:       Pneumovax  Saline Nasal Spray     Polio Booster         Tetanus Nausea:       Tuberculosis test or PPD  Vitamin B6 25 mg TID   AVOID:    Dramamine      *Gardasil  Emetrol       Live Poliovirus  Ginger Root 250 mg QID    MMR (measles, mumps &  High Complex Carbs @ Bedtime    rebella)  Sea Bands-Accupressure    Varicella (Chickenpox)  Unisom 1/2 tab TID     *No known complications           If received before Pain:         Known pregnancy;   Darvocet       Resume series  after  Lortab        Delivery  Percocet    Yeast:   Tramadol      Femstat  Tylenol 3      Gyne-lotrimin  Ultram       Monistat  Vicodin           MISC:         All Sunscreens           Hair Coloring/highlights          Insect Repellant's          (Including DEET)         Mystic Tans  

## 2022-12-15 ENCOUNTER — Other Ambulatory Visit (HOSPITAL_COMMUNITY)
Admission: RE | Admit: 2022-12-15 | Discharge: 2022-12-15 | Disposition: A | Discharge status: Home / Self Care | Payer: BC Managed Care – PPO | Source: Ambulatory Visit | Attending: Certified Nurse Midwife | Admitting: Certified Nurse Midwife

## 2022-12-15 ENCOUNTER — Other Ambulatory Visit: Payer: BC Managed Care – PPO

## 2022-12-15 ENCOUNTER — Ambulatory Visit (INDEPENDENT_AMBULATORY_CARE_PROVIDER_SITE_OTHER): Payer: BC Managed Care – PPO

## 2022-12-15 VITALS — BP 125/67 | HR 85 | Wt 154.8 lb

## 2022-12-15 DIAGNOSIS — N760 Acute vaginitis: Secondary | ICD-10-CM

## 2022-12-15 DIAGNOSIS — Z369 Encounter for antenatal screening, unspecified: Secondary | ICD-10-CM | POA: Diagnosis not present

## 2022-12-15 DIAGNOSIS — B9689 Other specified bacterial agents as the cause of diseases classified elsewhere: Secondary | ICD-10-CM | POA: Diagnosis not present

## 2022-12-15 DIAGNOSIS — Z3480 Encounter for supervision of other normal pregnancy, unspecified trimester: Secondary | ICD-10-CM

## 2022-12-15 DIAGNOSIS — Z130 Encounter for screening for diseases of the blood and blood-forming organs and certain disorders involving the immune mechanism: Secondary | ICD-10-CM

## 2022-12-15 NOTE — Progress Notes (Signed)
    NURSE VISIT NOTE  Subjective:    Patient ID: Kristi Bauer, female    DOB: 2000-09-16, 22 y.o.   MRN: [id number]  HPI  Patient is a 22 y.o. G22P0020 female who presents for normal vaginal discharge for 1 day(s). Denies abnormal vaginal bleeding or significant pelvic pain or fever. . Patient denies history of known exposure to STD. Patient states she has a little odor.   Objective:    BP 125/67   Pulse 85   Wt 154 lb 12.8 oz (70.2 kg)   LMP 09/29/2022 (Exact Date)   BMI 28.78 kg/m    @THIS  VISIT ONLY@  Assessment:   1. BV (bacterial vaginosis)       Plan:  Self swab probe sent to lab. Treatment: Will call with results  ROV prn if symptoms persist or worsen.   Burtis Junes, CMA

## 2022-12-16 ENCOUNTER — Other Ambulatory Visit: Payer: Self-pay

## 2022-12-16 ENCOUNTER — Telehealth: Payer: Self-pay

## 2022-12-16 DIAGNOSIS — N760 Acute vaginitis: Secondary | ICD-10-CM

## 2022-12-16 DIAGNOSIS — B379 Candidiasis, unspecified: Secondary | ICD-10-CM

## 2022-12-16 LAB — URINALYSIS, ROUTINE W REFLEX MICROSCOPIC
Bilirubin, UA: NEGATIVE
Glucose, UA: NEGATIVE
Ketones, UA: NEGATIVE
Leukocytes,UA: NEGATIVE
Nitrite, UA: NEGATIVE
Specific Gravity, UA: 1.022 (ref 1.005–1.030)
Urobilinogen, Ur: 0.2 mg/dL (ref 0.2–1.0)
pH, UA: 7.5 (ref 5.0–7.5)

## 2022-12-16 LAB — CERVICOVAGINAL ANCILLARY ONLY
Bacterial Vaginitis (gardnerella): POSITIVE — AB
Candida Glabrata: NEGATIVE
Candida Vaginitis: POSITIVE — AB
Comment: NEGATIVE
Comment: NEGATIVE
Comment: NEGATIVE

## 2022-12-16 LAB — MICROSCOPIC EXAMINATION
Bacteria, UA: NONE SEEN
Casts: NONE SEEN /lpf
Epithelial Cells (non renal): >10 /hpf — AB (ref 0–10)

## 2022-12-16 MED ORDER — METRONIDAZOLE 500 MG PO TABS
500.0000 mg | ORAL_TABLET | Freq: Two times a day (BID) | ORAL | 0 refills | Status: DC
Start: 2022-12-16 — End: 2023-01-19

## 2022-12-16 NOTE — Telephone Encounter (Signed)
Patient has been advised and verbalizes understanding.KW

## 2022-12-16 NOTE — Telephone Encounter (Signed)
Patient contacted office requesting provider review over recent urinalysis results, patient states that she received message in my chart that urinalysis is abnormal.Urine Culture results are still pending and new OB labs are still pending. KW

## 2022-12-17 LAB — MONITOR DRUG PROFILE 14(MW)
Amphetamine Scrn, Ur: NEGATIVE ng/mL
BARBITURATE SCREEN URINE: NEGATIVE ng/mL
BENZODIAZEPINE SCREEN, URINE: NEGATIVE ng/mL
Buprenorphine, Urine: NEGATIVE ng/mL
CANNABINOIDS UR QL SCN: NEGATIVE ng/mL
Cocaine (Metab) Scrn, Ur: NEGATIVE ng/mL
Creatinine(Crt), U: 160.2 mg/dL (ref 20.0–300.0)
Fentanyl, Urine: NEGATIVE pg/mL
Meperidine Screen, Urine: NEGATIVE ng/mL
Methadone Screen, Urine: NEGATIVE ng/mL
OXYCODONE+OXYMORPHONE UR QL SCN: NEGATIVE ng/mL
Opiate Scrn, Ur: NEGATIVE ng/mL
Ph of Urine: 7.3 (ref 4.5–8.9)
Phencyclidine Qn, Ur: NEGATIVE ng/mL
Propoxyphene Scrn, Ur: NEGATIVE ng/mL
SPECIFIC GRAVITY: 1.027
Tramadol Screen, Urine: NEGATIVE ng/mL

## 2022-12-17 LAB — NICOTINE SCREEN, URINE: Cotinine Ql Scrn, Ur: NEGATIVE ng/mL

## 2022-12-17 LAB — CULTURE, OB URINE

## 2022-12-17 LAB — URINE CULTURE, OB REFLEX

## 2022-12-18 LAB — CBC/D/PLT+RPR+RH+ABO+RUBIGG...
Antibody Screen: NEGATIVE
Basophils Absolute: 0.1 10*3/uL (ref 0.0–0.2)
Basos: 1 %
EOS (ABSOLUTE): 0.1 10*3/uL (ref 0.0–0.4)
Eos: 1 %
HCV Ab: NONREACTIVE
HIV Screen 4th Generation wRfx: NONREACTIVE
Hematocrit: 43.8 % (ref 34.0–46.6)
Hemoglobin: 14.6 g/dL (ref 11.1–15.9)
Hepatitis B Surface Ag: NEGATIVE
Immature Grans (Abs): 0 10*3/uL (ref 0.0–0.1)
Immature Granulocytes: 0 %
Lymphocytes Absolute: 2.1 10*3/uL (ref 0.7–3.1)
Lymphs: 20 %
MCH: 29.6 pg (ref 26.6–33.0)
MCHC: 33.3 g/dL (ref 31.5–35.7)
MCV: 89 fL (ref 79–97)
Monocytes Absolute: 0.7 10*3/uL (ref 0.1–0.9)
Monocytes: 7 %
Neutrophils Absolute: 7.7 10*3/uL — ABNORMAL HIGH (ref 1.4–7.0)
Neutrophils: 71 %
Platelets: 218 10*3/uL (ref 150–450)
RBC: 4.94 x10E6/uL (ref 3.77–5.28)
RDW: 12.6 % (ref 11.7–15.4)
RPR Ser Ql: NONREACTIVE
Rh Factor: POSITIVE
Rubella Antibodies, IGG: 1.52 index (ref >0.99)
Varicella zoster IgG: <135 index — ABNORMAL LOW (ref >165)
WBC: 10.7 10*3/uL (ref 3.4–10.8)

## 2022-12-18 LAB — HGB FRACTIONATION CASCADE
Hgb A2: 2.4 % (ref 1.8–3.2)
Hgb A: 97.6 % (ref 96.4–98.8)
Hgb F: 0 % (ref 0.0–2.0)
Hgb S: 0 %

## 2022-12-18 LAB — HCV INTERPRETATION

## 2022-12-20 LAB — MATERNIT 21 PLUS CORE, BLOOD
Fetal Fraction: 5
Result (T21): NEGATIVE
Trisomy 13 (Patau syndrome): NEGATIVE
Trisomy 18 (Edwards syndrome): NEGATIVE
Trisomy 21 (Down syndrome): NEGATIVE

## 2022-12-21 ENCOUNTER — Other Ambulatory Visit: Payer: BC Managed Care – PPO

## 2022-12-22 ENCOUNTER — Other Ambulatory Visit (HOSPITAL_COMMUNITY)
Admission: RE | Admit: 2022-12-22 | Discharge: 2022-12-22 | Disposition: A | Discharge status: Home / Self Care | Payer: BC Managed Care – PPO | Source: Ambulatory Visit | Attending: Advanced Practice Midwife | Admitting: Advanced Practice Midwife

## 2022-12-22 ENCOUNTER — Encounter: Payer: Self-pay | Admitting: Advanced Practice Midwife

## 2022-12-22 ENCOUNTER — Ambulatory Visit (INDEPENDENT_AMBULATORY_CARE_PROVIDER_SITE_OTHER): Payer: BC Managed Care – PPO | Admitting: Advanced Practice Midwife

## 2022-12-22 VITALS — BP 102/60 | Wt 150.0 lb

## 2022-12-22 DIAGNOSIS — Z113 Encounter for screening for infections with a predominantly sexual mode of transmission: Secondary | ICD-10-CM

## 2022-12-22 DIAGNOSIS — Z3481 Encounter for supervision of other normal pregnancy, first trimester: Secondary | ICD-10-CM

## 2022-12-22 DIAGNOSIS — Z3A12 12 weeks gestation of pregnancy: Secondary | ICD-10-CM | POA: Diagnosis not present

## 2022-12-22 LAB — POCT URINALYSIS DIPSTICK OB
Bilirubin, UA: NEGATIVE
Blood, UA: NEGATIVE
Glucose, UA: NEGATIVE
Ketones, UA: NEGATIVE
Leukocytes, UA: NEGATIVE
Nitrite, UA: NEGATIVE
POC,PROTEIN,UA: NEGATIVE
Spec Grav, UA: 1.01 (ref 1.010–1.025)
Urobilinogen, UA: 1 E.U./dL
pH, UA: 6.5 (ref 5.0–8.0)

## 2022-12-22 NOTE — Progress Notes (Addendum)
Ob Gyn  New Obstetric Patient H&P    Chief Complaint: "Desires prenatal care"   History of Present Illness: Patient is a 22 y.o. [id number] Not Hispanic or Latino female, presents with amenorrhea and positive home pregnancy test. Patient's last menstrual period was 09/29/2022 (exact date). and based on her LMP, her EDD is Estimated Date of Delivery: 07/06/23 and her EGA is [redacted]w[redacted]d. Cycles are 7 days, regular, and occur approximately every : 28 days.    She had a urine pregnancy test which was positive 2 month(s)  ago. Her last menstrual period was normal and lasted for  7 day(s). Since her LMP she claims she has experienced breast tenderness, fatigue, nausea, vomiting. She denies vaginal bleeding. Her past medical history is noncontributory. Her prior pregnancies are notable for  2 SABs in 2023.  Since her LMP, she admits to the use of tobacco products  no She claims she has gained  8  pounds since the start of her pregnancy.  There are cats in the home in the home  no  She admits close contact with children on a regular basis  no  She has had chicken pox in the past no She has had Tuberculosis exposures, symptoms, or previously tested positive for TB   no Current or past history of domestic violence. no  Genetic Screening/Teratology Counseling: (Includes patient, baby's father, or anyone in either family with:)   1. Patient's age >/= 15 at [facility]  no 2. Thalassemia ([place], [place], Mediterranean, or Asian background): MCV<80  no 3. Neural tube defect (meningomyelocele, spina bifida, anencephaly)  no 4. Congenital heart defect  no  5. Down syndrome  no 6. Tay-Sachs (Jewish, [place]  no 7. Canavan's Disease  no 8. Sickle cell disease or trait (African)  FOB with trait  9. Hemophilia or other blood disorders  no  10. Muscular dystrophy  no  11. Cystic fibrosis  no  12. Huntington's Chorea  no  13. Mental retardation/autism  no 14. Other inherited genetic or chromosomal  disorder  no 15. Maternal metabolic disorder (DM, PKU, etc)  no 16. Patient or FOB with a child with a birth defect not listed above no  16a. Patient or FOB with a birth defect themselves no 17. Recurrent pregnancy loss, or stillbirth  no  18. Any medications since LMP other than prenatal vitamins (include vitamins, supplements, OTC meds, drugs, alcohol)  no 19. Any other genetic/environmental exposure to discuss  no  Infection History:   1. Lives with someone with TB or TB exposed  no  2. Patient or partner has history of genital herpes  no 3. Rash or viral illness since LMP  no 4. History of STI (GC, CT, HPV, syphilis, HIV)  Hx of CT 5. 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    Review of Systems:10 point review of systems negative unless otherwise noted in HPI  Past Medical History:  Patient Active Problem List   Diagnosis Date Noted   Supervision of other normal pregnancy, antepartum 11/26/2022     Clinical Staff Provider  Office Location   Ob/Gyn Dating  07/06/2023, Alternate EDD Entry  Language  English Anatomy US    Flu Vaccine  Offer Genetic Screen  NIPS:   TDaP vaccine   offer Hgb A1C or  GTT Early : Third trimester :   Covid No boosters   LAB RESULTS   Rhogam  Blood Type     Feeding Plan breast Antibody    Contraception undecided Rubella    Circumcision yes RPR     Pediatrician  undecided HBsAg     Support Person Onalee Hua, Mom HIV    Prenatal Classes yes Varicella     GBS  (For PCN allergy, check sensitivities)   BTL Consent  Hep C     VBAC Consent  Pap No results found for: "DIAGPAP"    Hgb Electro      CF      SMA            Depression, major, single episode, 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  Obstetric History: G3P0020  Family History:  Family History  Problem Relation Age of Onset   Hypertension Mother    Hypertension Father    Healthy Sister    Healthy Brother    Cancer Maternal Grandmother        breast   Heart disease Maternal Grandmother    Hypertension Maternal Grandmother    Heart disease Paternal Grandmother    Hypertension Paternal Grandmother    Cancer Paternal Grandfather        prostate unsure of age; and bone 80   Cancer Maternal Aunt        breast   Heart disease Maternal Aunt    Heart disease Paternal Uncle     Social History:  Social History   Socioeconomic History   Marital status: Single    Spouse name: Not on file   Number of children: 0   Years of education: 13   Highest education level: Not on file  Occupational History   Occupation: [employer] - is an Geophysicist/field seismologist   Occupation: Consulting civil engineer at [employer]  Tobacco Use   Smoking status: Never   Smokeless tobacco: Never  Vaping Use   Vaping Use: Never used  Substance and Sexual Activity   Alcohol use: No   Drug use: No   Sexual activity: Yes    Partners: Male    Birth control/protection: None  Other Topics Concern   Not on file  Social History Narrative   Not on file   Social Determinants of Health   Financial Resource Strain: Low Risk  (11/26/2022)   Overall Financial Resource Strain (CARDIA)    Difficulty of Paying Living Expenses: Not hard at all  Food Insecurity: No Food Insecurity (11/26/2022)   Hunger Vital Sign    Worried About Running Out of Food in the Last Year: Never true    Ran Out of Food in the Last Year: Never true  Transportation Needs: No Transportation Needs (11/26/2022)   PRAPARE - Administrator, Civil Service (Medical): No    Lack of Transportation (Non-Medical): No  Physical Activity: Inactive (11/26/2022)    Exercise Vital Sign    Days of Exercise per Week: 0 days    Minutes of Exercise per Session: 0 min  Stress: No Stress Concern Present (11/26/2022)   [employer] of Occupational Health - Occupational Stress Questionnaire    Feeling of Stress : Not at all  Social Connections: Moderately Isolated (11/26/2022)   Social Connection and Isolation  Panel [NHANES]    Frequency of Communication with Friends and Family: More than three times a week    Frequency of Social Gatherings with Friends and Family: Once a week    Attends Religious Services: Never    Database administrator or Organizations: No    Attends Banker Meetings: Never    Marital Status: Living with partner  Intimate Partner Violence: Not At Risk (11/26/2022)   Humiliation, Afraid, Rape, and Kick questionnaire    Fear of Current or Ex-Partner: No    Emotionally Abused: No    Physically Abused: No    Sexually Abused: No    Allergies:  No Known Allergies  Medications: Prior to Admission medications   Medication Sig Start Date End Date Taking? Authorizing Provider  metroNIDAZOLE (FLAGYL) 500 MG tablet Take 1 tablet (500 mg total) by mouth 2 (two) times daily. 12/16/22  Yes Mirna Mires, CNM  Prenatal Vit-Fe Fumarate-FA (PRENATAL PO) Take by mouth.   Yes [provider]  sertraline (ZOLOFT) 50 MG tablet TAKE 1 TABLET BY MOUTH EVERY DAY 10/09/22  Yes Linzie Collin, MD    Physical Exam Vitals: Blood pressure 102/60, weight 150 lb (68 kg), last menstrual period 09/29/2022, unknown if currently breastfeeding.  General: NAD HEENT: normocephalic, anicteric Thyroid: no enlargement, no palpable nodules Pulmonary: No increased work of breathing, CTAB Cardiovascular: RRR, distal pulses 2+ Abdomen: NABS, soft, non-tender, non-distended.  Umbilicus without lesions.  No hepatomegaly, splenomegaly or masses palpable. No evidence of hernia  Genitourinary: deferred for labs previously done Extremities: no  edema, erythema, or tenderness Neurologic: Grossly intact Psychiatric: mood appropriate, affect full   The following were addressed during this visit:  Breastfeeding Education - Early initiation of breastfeeding    Comments: Keeps milk supply adequate, helps contract uterus and slow bleeding, and early milk is the perfect first food and is easy to digest.   - The importance of exclusive breastfeeding    Comments: Provides antibodies, Lower risk of breast and ovarian cancers, and type-2 diabetes,Helps your body recover, Reduced chance of SIDS.   - Risks of giving your baby anything other than breast milk if you are breastfeeding    Comments: Make the baby less content with breastfeeds, may make my baby more susceptible to illness, and may reduce my milk supply.   - The importance of early skin-to-skin contact    Comments:  Keeps baby warm and secure, helps keep baby's blood sugar up and breathing steady, easier to bond and breastfeed, and helps calm baby.  - Rooming-in on a 24-hour basis    Comments: Easier to learn baby's feeding cues, easier to bond and get to know each other, and encourages milk production.   - Feeding on demand or baby-led feeding    Comments: Helps prevent breastfeeding complications, helps bring in good milk supply, prevents under or overfeeding, and helps baby feel content and satisfied   - Frequent feeding to help assure optimal milk production    Comments: Making a full supply of milk requires frequent removal of milk from breasts, infant will eat 8-12 times in 24 hours, if separated from infant use breast massage, hand expression and/ or pumping to remove milk from breasts.   - Effective positioning and attachment    Comments: Helps my baby to get enough breast milk, helps to produce an adequate milk supply, and helps prevent nipple pain and damage   - Exclusive breastfeeding for the first 6 months    Comments:  Builds a healthy milk supply and keeps  it up, protects baby from sickness and disease, and breastmilk has everything your baby needs for the first 6 months.    Assessment: 22 y.o. G3P0020 at [redacted]w[redacted]d presenting to initiate prenatal care  Plan: 1) Avoid alcoholic beverages. 2) Patient encouraged not to smoke.  3) Discontinue the use of all non-medicinal drugs and chemicals.  4) Take prenatal vitamins daily.  5) Nutrition, food safety (fish, cheese advisories, and high nitrite foods) and exercise discussed. 6) Hospital and practice style discussed with cross coverage system.  7) Genetic Screening, such as with 1st Trimester Screening, cell free fetal DNA, AFP testing, and Ultrasound, as well as with amniocentesis and CVS as appropriate, is discussed with patient. At the conclusion of today's visit patient requested genetic testing 8) Patient is asked about travel to areas at risk for the Zika virus, and counseled to avoid travel and exposure to mosquitoes or sexual partners who may have themselves been exposed to the virus. Testing is discussed, and will be ordered as appropriate.  9) Urine cytology collected today 10) Return to clinic in 4 weeks for ROB-    Tresea Mall, CNM [facility] 12/22/2022 1:18 PM

## 2022-12-22 NOTE — Patient Instructions (Signed)
Prenatal Care Prenatal care is health care during pregnancy. It helps you and your unborn baby (fetus) stay as healthy as possible. Prenatal care may be provided by a midwife, a family practice doctor, a mid-level practitioner (nurse practitioner or physician assistant), or a childbirth and pregnancy doctor (obstetrician). How does this affect me? During pregnancy, you will be closely monitored for any new conditions that might develop. To lower your risk of pregnancy complications, you and your health care provider will talk about any underlying conditions you have. How does this affect my baby? Early and consistent prenatal care increases the chance that your baby will be healthy during pregnancy. Prenatal care lowers the risk that your baby will be: Born early (prematurely). Smaller than expected at birth (small for gestational age). What can I expect at the first prenatal care visit? Your first prenatal care visit will likely be the longest. You should schedule your first prenatal care visit as soon as you know that you are pregnant. Your first visit is a good time to talk about any questions or concerns you have about pregnancy. Medical history At your visit, you and your health care provider will talk about your medical history, including: Any past pregnancies. Your family's medical history. Medical history of the baby's father. Any long-term (chronic) health conditions you have and how you manage them. Any surgeries or procedures you have had. Any current over-the-counter or prescription medicines, herbs, or supplements that you are taking. Other factors that could pose a risk to your baby, including: Exposure to harmful chemicals or radiation at work or at home. Any substance use, including tobacco, alcohol, and drug use. Your home setting and your stress levels, including: Exposure to abuse or violence. Household financial strain. Your daily health habits, including diet and  exercise. Tests and screenings Your health care provider will: Measure your weight, height, and blood pressure. Do a physical exam, including a pelvic and breast exam. Perform blood tests and urine tests to check for: Urinary tract infection. Sexually transmitted infections (STIs). Low iron levels in your blood (anemia). Blood type and certain proteins on red blood cells (Rh antibodies). Infections and immunity to viruses, such as hepatitis B and rubella. HIV (human immunodeficiency virus). Discuss your options for genetic screening. Tips about staying healthy Your health care provider will also give you information about how to keep yourself and your baby healthy, including: Nutrition and taking vitamins. Physical activity. How to manage pregnancy symptoms such as nausea and vomiting (morning sickness). Infections and substances that may be harmful to your baby and how to avoid them. Food safety. Dental care. Working. Travel. Warning signs to watch for and when to call your health care provider. How often will I have prenatal care visits? After your first prenatal care visit, you will have regular visits throughout your pregnancy. The visit schedule is often as follows: Up to week 28 of pregnancy: once every 4 weeks. 28-36 weeks: once every 2 weeks. After 36 weeks: every week until delivery. Some women may have visits more or less often depending on any underlying health conditions and the health of the baby. Keep all follow-up and prenatal care visits. This is important. What happens during routine prenatal care visits? Your health care provider will: Measure your weight and blood pressure. Check for fetal heart sounds. Measure the height of your uterus in your abdomen (fundal height). This may be measured starting around week 20 of pregnancy. Check the position of your baby inside your uterus. Ask questions   about your diet, sleeping patterns, and whether you can feel the baby  move. Review warning signs to watch for and signs of labor. Ask about any pregnancy symptoms you are having and how you are dealing with them. Symptoms may include: Headaches. Nausea and vomiting. Vaginal discharge. Swelling. Fatigue. Constipation. Changes in your vision. Feeling persistently sad or anxious. Any discomfort, including back or pelvic pain. Bleeding or spotting. Make a list of questions to ask your health care provider at your routine visits. What tests might I have during prenatal care visits? You may have blood, urine, and imaging tests throughout your pregnancy, such as: Urine tests to check for glucose, protein, or signs of infection. Glucose tests to check for a form of diabetes that can develop during pregnancy (gestational diabetes mellitus). This is usually done around week 24 of pregnancy. Ultrasounds to check your baby's growth and development, to check for birth defects, and to check your baby's well-being. These can also help to decide when you should deliver your baby. A test to check for group B strep (GBS) infection. This is usually done around week 36 of pregnancy. Genetic testing. This may include blood, fluid, or tissue sampling, or imaging tests, such as an ultrasound. Some genetic tests are done during the first trimester and some are done during the second trimester. What else can I expect during prenatal care visits? Your health care provider may recommend getting certain vaccines during pregnancy. These may include: A yearly flu shot (annual influenza vaccine). This is especially important if you will be pregnant during flu season. Tdap (tetanus, diphtheria, pertussis) vaccine. Getting this vaccine during pregnancy can protect your baby from whooping cough (pertussis) after birth. This vaccine may be recommended between weeks 27 and 36 of pregnancy. A COVID-19 vaccine. Later in your pregnancy, your health care provider may give you information  about: Childbirth and breastfeeding classes. Choosing a health care provider for your baby. Umbilical cord banking. Breastfeeding. Birth control after your baby is born. The hospital labor and delivery unit and how to set up a tour. Registering at the hospital before you go into labor. Where to find more information Office on Women's Health: womenshealth.gov American Pregnancy Association: americanpregnancy.org March of Dimes: marchofdimes.org Summary Prenatal care helps you and your baby stay as healthy as possible during pregnancy. Your first prenatal care visit will most likely be the longest. You will have visits and tests throughout your pregnancy to monitor your health and your baby's health. Bring a list of questions to your visits to ask your health care provider. Make sure to keep all follow-up and prenatal care visits. This information is not intended to replace advice given to you by your health care provider. Make sure you discuss any questions you have with your health care provider. Document Revised: 05/02/2020 Document Reviewed: 05/02/2020 Elsevier Patient Education  2023 [employer] Exercise During Pregnancy Exercise is an important part of being healthy for people of all ages. Exercise improves the function of your heart and lungs and helps you maintain strength, flexibility, and a healthy body weight. Exercise also boosts energy levels and elevates mood. Most women should exercise regularly during pregnancy. Exercise routines may need to change as your pregnancy progresses. In rare cases, women with certain medical conditions or complications may be asked to limit or avoid exercise during pregnancy. Your health care provider will give you information on what will work for you. How does this affect me? Along with maintaining general strength and flexibility, exercising during   pregnancy can help: Keep strength in muscles that are used during labor and  childbirth. Decrease low back pain or symptoms of depression. Control weight gain during pregnancy. Reduce the risk of needing insulin if you develop diabetes during pregnancy. Decrease the risk of cesarean delivery. Speed up your recovery after giving birth. Relieve constipation. How does this affect my baby? Exercise can help you have a healthy pregnancy. Exercise does not cause early (premature) birth. It will not cause your baby to weigh less at birth. What exercises can I do? Many exercises are safe for you to do during pregnancy. Do a variety of exercises that safely increase your heart and breathing rates and help you build and maintain muscle strength. Do exercises exactly as told by your health care provider. You may do these exercises: Walking. Swimming. Water aerobics. Riding a stationary bike. Modified yoga or Pilates. Tell your instructor that you are pregnant. Avoid overstretching, and avoid lying on your back for long periods of time. Running or jogging. Choose this type of exercise only if: You ran or jogged regularly before your pregnancy. You can run or jog and still talk in complete sentences. What exercises should I avoid? You may be told to limit high-intensity exercise depending on your level of fitness and whether you exercised regularly before you were pregnant. You can tell that you are exercising at a high intensity if you are breathing much harder and faster and cannot hold a conversation while exercising. You must avoid: Contact sports. Activities that put you at risk for falling on or being hit in the belly, such as downhill skiing, waterskiing, surfing, rock climbing, cycling, gymnastics, and horseback riding. Scuba diving. Skydiving. Hot yoga or hot Pilates. These activities take place in a room that is heated to high temperatures. Jogging or running, unless you jogged or ran regularly before you were pregnant. While jogging or running, you should always be  able to talk in full sentences. Do not run or jog so fast that you are unable to have a conversation. Do not exercise at more than 6,000 feet above sea level (high elevation) if you are not used to exercising at high elevation. How do I exercise in a safe way?  Avoid overheating. Do not exercise in very high temperatures. Wear loose-fitting, breathable clothes. Avoid dehydration. Drink enough fluid before, during, and after exercise to keep your urine pale yellow. Avoid overstretching. Because of hormone changes during pregnancy, it is easy to overstretch muscles, tendons, and ligaments. Start slowly and ask your health care provider to recommend the types of exercise that are safe for you. Do not exercise to lose weight. Wear a sports bra to support your breasts. Avoid standing still or lying flat on your back as much as you can. Follow these instructions at home: Exercise on most days or all days of the week. Try to exercise for 30 minutes a day, 5 days a week, unless your health care provider tells you not to. If you actively exercised before your pregnancy and you are healthy, your health care provider may tell you to continue to do moderate-intensity to high-intensity exercise. If you are just starting to exercise or did not exercise much before your pregnancy, your health care provider may tell you to do low-intensity to moderate-intensity exercise. Questions to ask your health care provider Is exercise safe for me? What are signs that I should stop exercising? Does my health condition mean that I should not exercise during pregnancy? When should I   avoid exercising during pregnancy? Stop exercising and contact a health care provider if: You have any unusual symptoms such as: Mild contractions of the uterus or cramps in the abdomen. A dizzy feeling that does not go away when you rest. Stop exercising and get help right away if: You have any unusual symptoms such as: Sudden, severe  pain in your low back or your belly. Regular, painful contractions of your uterus. Chest pain. Bleeding or fluid leaking from your vagina. Shortness of breath. Headache. Pain and swelling of your calves. Summary Most women should exercise regularly throughout pregnancy. In rare cases, women with certain medical conditions or complications may be asked to limit or avoid exercise during pregnancy. Do not exercise to lose weight during pregnancy. Your health care provider will tell you what level of physical activity is right for you. Stop exercising and contact a health care provider if you have unusual symptoms, such as mild contractions or dizziness. This information is not intended to replace advice given to you by your health care provider. Make sure you discuss any questions you have with your health care provider. Document Revised: 03/06/2020 Document Reviewed: 03/06/2020 Elsevier Patient Education  2023 [employer] Eating Plan for Pregnant Women While you are pregnant, your body requires additional nutrition to help support your growing baby. You also have a higher need for some vitamins and minerals, such as folic acid, calcium, iron, and vitamin D. Eating a healthy, well-balanced diet is very important for your health and your baby's health. Your need for extra calories varies over the course of your pregnancy. Pregnancy is divided into three trimesters, with each trimester lasting 3 months. For most women, it is recommended to consume: 150 extra calories a day during the first trimester. 300 extra calories a day during the second trimester. 300 extra calories a day during the third trimester. What are tips for following this plan? Cooking Practice good food safety and cleanliness. Wash your hands before you eat and after you prepare raw meat. Wash all fruits and vegetables well before peeling or eating. Taking these actions can help to prevent foodborne illnesses that can be very  dangerous to your baby, such as listeriosis. Ask your health care provider for more information about listeriosis. Make sure that all meats, poultry, and eggs are cooked to food-safe temperatures or "well-done." Meal planning  Eat a variety of foods (especially fruits and vegetables) to get a full range of vitamins and minerals. Two or more servings of fish are recommended each week in order to get the most benefits from omega-3 fatty acids that are found in seafood. Choose fish that are lower in mercury, such as salmon and pollock. Limit your overall intake of foods that have "empty calories." These are foods that have little nutritional value, such as sweets, desserts, candies, and sugar-sweetened beverages. Drinks that contain caffeine are okay to drink, but it is better to avoid caffeine. Keep your total caffeine intake to less than 200 mg each day (which is 12 oz or 355 mL of coffee, tea, or soda) or the limit as told by your health care provider. General information Do not try to lose weight or go on a diet during pregnancy. Take a prenatal vitamin to help meet your additional vitamin and mineral needs during pregnancy, specifically for folic acid, iron, calcium, and vitamin D. Remember to stay active. Ask your health care provider what types of exercise and activities are safe for you. What does 150 extra calories look like?   Healthy options that provide 150 extra calories each day could be any of the following: 6-8 oz (170-227 g) plain low-fat yogurt with  cup (70 g) berries. 1 apple with 2 tsp (11 g) peanut butter. Cut-up vegetables with  cup (60 g) hummus. 8 fl oz (237 mL) low-fat chocolate milk. 1 stick of string cheese with 1 medium orange. 1 peanut butter and jelly sandwich that is made with one slice of whole-wheat bread and 1 tsp (5 g) of peanut butter. For 300 extra calories, you could eat two of these healthy options each day. What is a healthy amount of weight to gain? The  right amount of weight gain for you is based on your BMI (body mass index) before you became pregnant. If your BMI was less than 18 (underweight), you should gain 28-40 lb (13-18 kg). If your BMI was 18-24.9 (normal), you should gain 25-35 lb (11-16 kg). If your BMI was 25-29.9 (overweight), you should gain 15-25 lb (7-11 kg). If your BMI was 30 or greater (obese), you should gain 11-20 lb (5-9 kg). What if I am having twins or multiples? Generally, if you are carrying twins or multiples: You may need to eat 300-600 extra calories a day. The recommended range for total weight gain is 25-54 lb (11-25 kg), depending on your BMI before pregnancy. Talk with your health care provider to find out about nutritional needs, weight gain, and exercise that is right for you. What foods should I eat?  Fruits All fruits. Eat a variety of colors and types of fruit. Remember to wash your fruits well before peeling or eating. Vegetables All vegetables. Eat a variety of colors and types of vegetables. Remember to wash your vegetables well before peeling or eating. Grains All grains. Choose whole grains, such as whole-wheat bread, oatmeal, or brown rice. Meats and other protein foods Lean meats, including chicken, turkey, and lean cuts of beef, veal, or pork. Fish that is higher in omega-3 fatty acids and lower in mercury, such as salmon, herring, mussels, trout, sardines, pollock, shrimp, crab, and lobster. Tofu. Tempeh. Beans. Eggs. Peanut butter and other nut butters. Dairy Pasteurized milk and milk alternatives, such as almond milk. Pasteurized yogurt and pasteurized cheese. Cottage cheese. Sour cream. Beverages Water. Juices that contain 100% fruit juice or vegetable juice. Caffeine-free teas and decaffeinated coffee. Fats and oils Fats and oils are okay to include in moderation. Sweets and desserts Sweets and desserts are okay to include in moderation. Seasoning and other foods All pasteurized  condiments. The items listed above may not be a complete list of foods and beverages you can eat. Contact a dietitian for more information. What foods should I avoid? Fruits Raw (unpasteurized) fruit juices. Vegetables Unpasteurized vegetable juices. Meats and other protein foods Precooked or cured meat, such as bologna, hot dogs, sausages, or meat loaves. (If you must eat those meats, reheat them until they are steaming hot.) Refrigerated pate, meat spreads from a meat counter, or smoked seafood that is found in the refrigerated section of a store. Raw or undercooked meats, poultry, and eggs. Raw fish, such as sushi or sashimi. Fish that have high mercury content, such as tilefish, shark, swordfish, and king mackerel. Dairy Unpasteurized milk and any foods that have unpasteurized milk in them. Soft cheeses, such as feta, queso blanco, queso fresco, Brie, Camembert, panela, and blue-veined cheeses (unless they are made with pasteurized milk, which must be stated on the label). Beverages Alcohol. Sugar-sweetened beverages, such as sodas, teas, or   energy drinks. Seasoning and other foods Homemade fermented foods and drinks, such as pickles, sauerkraut, or kombucha drinks. (Store-bought pasteurized versions of these are okay.) Salads that are made in a store or deli, such as ham salad, chicken salad, egg salad, tuna salad, and seafood salad. The items listed above may not be a complete list of foods and beverages you should avoid. Contact a dietitian for more information. Where to find more information To calculate the number of calories you need based on your height, weight, and activity level, you can use an online calculator such as: www.myplate.gov/myplate-plan To calculate how much weight you should gain during pregnancy, you can use an online pregnancy weight gain calculator such as: www.myplate.gov To learn more about eating fish during pregnancy, talk with your health care provider or  visit: www.fda.gov Summary While you are pregnant, your body requires additional nutrition to help support your growing baby. Eat a variety of foods, especially fruits and vegetables, to get a full range of vitamins and minerals. Practice good food safety and cleanliness. Wash your hands before you eat and after you prepare raw meat. Wash all fruits and vegetables well before peeling or eating. Taking these actions can help to prevent foodborne illnesses, such as listeriosis, that can be very dangerous to your baby. Do not eat raw meat or fish. Do not eat fish that have high mercury content, such as tilefish, shark, swordfish, and king mackerel. Do not eat raw (unpasteurized) dairy. Take a prenatal vitamin to help meet your additional vitamin and mineral needs during pregnancy, specifically for folic acid, iron, calcium, and vitamin D. This information is not intended to replace advice given to you by your health care provider. Make sure you discuss any questions you have with your health care provider. Document Revised: 02/15/2020 Document Reviewed: 02/15/2020 Elsevier Patient Education  2023 [employer] Inc.  

## 2022-12-22 NOTE — Addendum Note (Signed)
Addended by: Cornelius Moras D on: 12/22/2022 01:30 PM   Modules accepted: Orders

## 2022-12-24 LAB — URINE CYTOLOGY ANCILLARY ONLY
Chlamydia: NEGATIVE
Comment: NEGATIVE
Comment: NORMAL
Neisseria Gonorrhea: NEGATIVE

## 2023-01-18 ENCOUNTER — Ambulatory Visit: Payer: BC Managed Care – PPO | Admitting: Gastroenterology

## 2023-01-19 ENCOUNTER — Encounter: Payer: Self-pay | Admitting: Obstetrics and Gynecology

## 2023-01-19 ENCOUNTER — Ambulatory Visit (INDEPENDENT_AMBULATORY_CARE_PROVIDER_SITE_OTHER): Payer: BC Managed Care – PPO | Admitting: Obstetrics and Gynecology

## 2023-01-19 VITALS — BP 129/75 | HR 100 | Wt 154.7 lb

## 2023-01-19 DIAGNOSIS — Z3402 Encounter for supervision of normal first pregnancy, second trimester: Secondary | ICD-10-CM

## 2023-01-19 DIAGNOSIS — Z3689 Encounter for other specified antenatal screening: Secondary | ICD-10-CM

## 2023-01-19 DIAGNOSIS — Z3A16 16 weeks gestation of pregnancy: Secondary | ICD-10-CM

## 2023-01-19 DIAGNOSIS — Z1379 Encounter for other screening for genetic and chromosomal anomalies: Secondary | ICD-10-CM

## 2023-01-19 LAB — POCT URINALYSIS DIPSTICK OB
Bilirubin, UA: NEGATIVE
Glucose, UA: NEGATIVE
Ketones, UA: NEGATIVE
Leukocytes, UA: NEGATIVE
Nitrite, UA: NEGATIVE
Spec Grav, UA: 1.015 (ref 1.010–1.025)
Urobilinogen, UA: 0.2 E.U./dL
pH, UA: 5 (ref 5.0–8.0)

## 2023-01-19 NOTE — Progress Notes (Signed)
ROB: Patient is a 22 y.o. G3P0020 at [redacted]w[redacted]d who presents for routine OB care.  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    Patient has no complaints.   Discussed second trimester expectations. Advised on second trimester screening with AFP, desires to think about it, can perform next visit if desired. Discussed prenatal classes and birth tour later in the pregnancy. RTC in 4 weeks, for anatomy scan.

## 2023-02-17 ENCOUNTER — Other Ambulatory Visit: Payer: BC Managed Care – PPO

## 2023-02-17 ENCOUNTER — Encounter: Payer: BC Managed Care – PPO | Admitting: Obstetrics

## 2023-02-19 ENCOUNTER — Ambulatory Visit (INDEPENDENT_AMBULATORY_CARE_PROVIDER_SITE_OTHER): Payer: BC Managed Care – PPO | Admitting: Certified Nurse Midwife

## 2023-02-19 ENCOUNTER — Ambulatory Visit (INDEPENDENT_AMBULATORY_CARE_PROVIDER_SITE_OTHER): Payer: BC Managed Care – PPO

## 2023-02-19 ENCOUNTER — Encounter: Payer: Self-pay | Admitting: Certified Nurse Midwife

## 2023-02-19 ENCOUNTER — Other Ambulatory Visit: Payer: Self-pay | Admitting: Certified Nurse Midwife

## 2023-02-19 VITALS — BP 124/86 | HR 88 | Wt 162.0 lb

## 2023-02-19 DIAGNOSIS — Z3689 Encounter for other specified antenatal screening: Secondary | ICD-10-CM

## 2023-02-19 DIAGNOSIS — Z3A20 20 weeks gestation of pregnancy: Secondary | ICD-10-CM | POA: Diagnosis not present

## 2023-02-19 DIAGNOSIS — Z362 Encounter for other antenatal screening follow-up: Secondary | ICD-10-CM

## 2023-02-19 DIAGNOSIS — Z3402 Encounter for supervision of normal first pregnancy, second trimester: Secondary | ICD-10-CM

## 2023-02-19 NOTE — Patient Instructions (Signed)
Gestational Diabetes Mellitus, Diagnosis Gestational diabetes mellitus, or gestational diabetes, is diabetes that some people get when pregnant. This condition usually happens at 24-28 weeks of pregnancy.  People with gestational diabetes have high blood sugar. If you do not get treated for this condition, it may cause problems for you and your baby. It goes away after you give birth but can happen again the next time you become pregnant. What are the causes? This condition is caused by changes in your body when you're pregnant. When these changes happen: The pancreas may not make enough insulin. The body may not use insulin in the right way. This is called insulin resistance. Insulin helps your body use sugar for energy. If your body doesn't have enough insulin or can't use the insulin that it has, extra sugars stay in your blood. This leads to high blood sugar (hyperglycemia). What increases the risk? Being older than age 70 when pregnant. Too much body weight. Having polycystic ovary syndrome (PCOS). Having someone with diabetes in your family. Having had this condition in the past. Being pregnant with more than one baby. What are the signs or symptoms? You may not have any symptoms. If you do have symptoms, they may include: Being thirsty often. Being hungry often. Needing to pee more often. These symptoms can be missed because they're similar to other symptoms of pregnancy. How is this diagnosed? This condition may be diagnosed based on your blood sugar level and how your body responds to glucose. This may be checked with an oral glucose tolerance test (OGTT). The test may be done: Early in pregnancy if you have risk factors. At 24-28 weeks of your pregnancy. How is this treated? To treat this condition, you may be told to: Eat a healthy diet. Get more exercise. Check your blood sugar often. Your health care provider will tell you what your target is. Take insulin and other  medicines. These are taken if needed. Work with a diabetes expert. Follow these instructions at home: Learn about your diabetes Ask your provider: How often should I check my blood sugar? Where do I get the equipment? What medicines do I need? When should I take them? Do I need to meet with an educator? Who can I call if I have questions? Where can I find a support group? General instructions Take medicines only as told by your provider. Stay at a healthy weight. Drink enough fluid to keep your pee (urine) pale yellow. Check your pee for ketones when sick and as told. Ketones in your pee is a sign that your body is using fat for energy because it's not making enough insulin. Wear an alert bracelet or carry a card that shows you have this condition. Keep all follow-up visits. Your provider needs to check your health and your baby's growth. Where to find more information Gestational Diabetes: American Diabetes Association (ADA): diabetes.org Gestational Diabetes and Pregnancy: Centers for Disease Control and Prevention (CDC): TonerPromos.no American Pregnancy Association: americanpregnancy.org U.[place] MyPlate: WrestlingReporter.dk Contact a health care provider if:  Your blood sugar is above your target for two tests in a row. You have a high blood sugar level and you also have ketones in your pee. You have been sick or have had a fever for 2 days or more and aren't getting better. You have any of these problems for more than 6 hours: You can't eat or drink. You vomit or feel like you may vomit. You have watery poop (diarrhea). Get help right away if:  You become confused or cannot think clearly. You have trouble breathing. You have chest pain. Your baby is moving less than usual. You have unusual discharge or bleeding from your vagina. You have cramping in your belly or have pain in your hips or lower back. You have symptoms of high blood pressure or preeclampsia. These include: A severe,  throbbing headache that doesn't go away. Sudden or extreme swelling of your face, hands, legs, or feet. Vision problems, such as: Seeing spots. Blurry vision. Sensitivity to light. These symptoms may be an emergency. Get help right away. Call 911. Do not wait to see if the symptoms will go away. Do not drive yourself to the hospital. This information is not intended to replace advice given to you by your health care provider. Make sure you discuss any questions you have with your health care provider. Document Revised: 10/30/2022 Document Reviewed: 10/30/2022 Elsevier Patient Education  2024 Elsevier Inc.  Second Trimester of Pregnancy  The second trimester of pregnancy is from week 13 through week 27. This is months 4 through 6 of pregnancy. The second trimester is often a time when you feel your best. Your body has adjusted to being pregnant, and you begin to feel better physically. During the second trimester: Morning sickness has lessened or stopped completely. You may have more energy. You may have an increase in appetite. The second trimester is also a time when the unborn baby (fetus) is growing rapidly. At the end of the sixth month, the fetus may be up to 12 inches long and weigh about 1 pounds. You will likely begin to feel the baby move (quickening) between 16 and 20 weeks of pregnancy. Body changes during your second trimester Your body continues to go through many changes during your second trimester. The changes vary and generally return to normal after the baby is born. Physical changes Your weight will continue to increase. You will notice your lower abdomen bulging out. You may begin to get stretch marks on your hips, abdomen, and breasts. Your breasts will continue to grow and to become tender. Dark spots or blotches (chloasma or mask of pregnancy) may develop on your face. A dark line from your belly button to the pubic area (linea nigra) may appear. You may have  changes in your hair. These can include thickening of your hair, rapid growth, and changes in texture. Some people also have hair loss during or after pregnancy, or hair that feels dry or thin. Health changes You may develop headaches. You may have heartburn. You may develop constipation. You may develop hemorrhoids or swollen, bulging veins (varicose veins). Your gums may bleed and may be sensitive to brushing and flossing. You may urinate more often because the fetus is pressing on your bladder. You may have back pain. This is caused by: Weight gain. Pregnancy hormones that are relaxing the joints in your pelvis. A shift in weight and the muscles that support your balance. Follow these instructions at home: Medicines Follow your health care provider's instructions regarding medicine use. Specific medicines may be either safe or unsafe to take during pregnancy. Do not take any medicines unless approved by your health care provider. Take a prenatal vitamin that contains at least 600 micrograms (mcg) of folic acid. Eating and drinking Eat a healthy diet that includes fresh fruits and vegetables, whole grains, good sources of protein such as meat, eggs, or tofu, and low-fat dairy products. Avoid raw meat and unpasteurized juice, milk, and cheese. These carry germs  that can harm you and your baby. You may need to take these actions to prevent or treat constipation: Drink enough fluid to keep your urine pale yellow. Eat foods that are high in fiber, such as beans, whole grains, and fresh fruits and vegetables. Limit foods that are high in fat and processed sugars, such as fried or sweet foods. Activity Exercise only as directed by your health care provider. Most people can continue their usual exercise routine during pregnancy. Try to exercise for 30 minutes at least 5 days a week. Stop exercising if you develop contractions in your uterus. Stop exercising if you develop pain or cramping in the  lower abdomen or lower back. Avoid exercising if it is very hot or humid or if you are at a high altitude. Avoid heavy lifting. If you choose to, you may have sex unless your health care provider tells you not to. Relieving pain and discomfort Wear a supportive bra to prevent discomfort from breast tenderness. Take warm sitz baths to soothe any pain or discomfort caused by hemorrhoids. Use hemorrhoid cream if your health care provider approves. Rest with your legs raised (elevated) if you have leg cramps or low back pain. If you develop varicose veins: Wear support hose as told by your health care provider. Elevate your feet for 15 minutes, 3-4 times a day. Limit salt in your diet. Safety Wear your seat belt at all times when driving or riding in a car. Talk with your health care provider if someone is verbally or physically abusive to you. Lifestyle Do not use hot tubs, steam rooms, or saunas. Do not douche. Do not use tampons or scented sanitary pads. Avoid cat litter boxes and soil used by cats. These carry germs that can cause birth defects in the baby and possibly loss of the fetus by miscarriage or stillbirth. Do not use herbal remedies, alcohol, illegal drugs, or medicines that are not approved by your health care provider. Chemicals in these products can harm your baby. Do not use any products that contain nicotine or tobacco, such as cigarettes, e-cigarettes, and chewing tobacco. If you need help quitting, ask your health care provider. General instructions During a routine prenatal visit, your health care provider will do a physical exam and other tests. He or she will also discuss your overall health. Keep all follow-up visits. This is important. Ask your health care provider for a referral to a local prenatal education class. Ask for help if you have counseling or nutritional needs during pregnancy. Your health care provider can offer advice or refer you to specialists for help  with various needs. Where to find more information American Pregnancy Association: americanpregnancy.org [employer]: https://www.todd-brady.net/ Office on [employer] Health: MightyReward.co.nz Contact a health care provider if you have: A headache that does not go away when you take medicine. Vision changes or you see spots in front of your eyes. Mild pelvic cramps, pelvic pressure, or nagging pain in the abdominal area. Persistent nausea, vomiting, or diarrhea. A bad-smelling vaginal discharge or foul-smelling urine. Pain when you urinate. Sudden or extreme swelling of your face, hands, ankles, feet, or legs. A fever. Get help right away if you: Have fluid leaking from your vagina. Have spotting or bleeding from your vagina. Have severe abdominal cramping or pain. Have difficulty breathing. Have chest pain. Have fainting spells. Have not felt your baby move for the time period told by your health care provider. Have new or increased pain, swelling, or  redness in an arm or leg. Summary The second trimester of pregnancy is from week 13 through week 27 (months 4 through 6). Do not use herbal remedies, alcohol, illegal drugs, or medicines that are not approved by your health care provider. Chemicals in these products can harm your baby. Exercise only as directed by your health care provider. Most people can continue their usual exercise routine during pregnancy. Keep all follow-up visits. This is important. This information is not intended to replace advice given to you by your health care provider. Make sure you discuss any questions you have with your health care provider. Document Revised: 12/27/2019 Document Reviewed: 11/02/2019 Elsevier Patient Education  2024 [employer].

## 2023-02-19 NOTE — Progress Notes (Signed)
   PRENATAL VISIT NOTE  Subjective:  Kristi Bauer is a 22 y.o. G3P0020 at [redacted]w[redacted]d being seen today for ongoing prenatal care.  She is currently monitored for the following issues for this low-risk pregnancy and has Altered mental status; Depression, major, single episode, mild (HCC); Flank pain; Pregnancy, location unknown; Black stool; and Supervision of other normal pregnancy, antepartum on their problem list.  Patient reports no complaints.  Contractions: Not present. Movement: Present. Denies leaking of fluid.  Anatomy ultrasound today. Plans to take childbirth classes, links for local [place] options sent via MyChart.  The following portions of the patient's history were reviewed and updated as appropriate: allergies, current medications, past family history, past medical history, past social history, past surgical history and problem list.   Objective:   Vitals:   02/19/23 0923  BP: 124/86  Pulse: 88  Weight: 162 lb (73.5 kg)   Total weight gain: 20 lb (9.072 kg) Fetal Status: Fetal Heart Rate (bpm): 140 Fundal Height:  (@U ) Movement: Present      General:  Alert, oriented and cooperative. Patient is in no acute distress.  Skin: Skin is warm and dry. No rash noted.   Cardiovascular: Normal heart rate noted  Respiratory: Normal respiratory effort, no problems with respiration noted  Abdomen: Soft, gravid, appropriate for gestational age.  Pain/Pressure: Absent     Pelvic: Cervical exam deferred        Extremities: Normal range of motion.     Mental Status: Normal mood and affect. Normal behavior. Normal judgment and thought content.   Assessment and Plan:  Pregnancy: G3P0020 at [redacted]w[redacted]d 1. Encounter for supervision of normal first pregnancy in second trimester  2. [redacted] weeks gestation of pregnancy   Preterm labor symptoms and general obstetric precautions including but not limited to vaginal bleeding, contractions, leaking of fluid and fetal movement were reviewed in detail  with the patient. Please refer to After Visit Summary for other counseling recommendations.   Return in 4 weeks (on 03/19/2023) for ROB.  Future Appointments  Date Time Provider Department Center  02/19/2023 11:00 AM [facility]-AOB [place] 1 AOB-IMG None    [name], CNM

## 2023-02-22 IMAGING — US US OB COMP LESS 14 WK
1 series · 14 of 28 positions shown · non-contrast
Comparison: 09/26/2021

CLINICAL DATA: Vaginal bleeding for 1 day

EXAM:
OBSTETRIC <14 WK ULTRASOUND
TECHNIQUE: Transabdominal ultrasound was performed for evaluation of the
gestation as well as the maternal uterus and adnexal regions.

[Series 1: us ob less than 14 weeks with ob transvaginal · 14 of 31 slices shown]
[im 2/31]
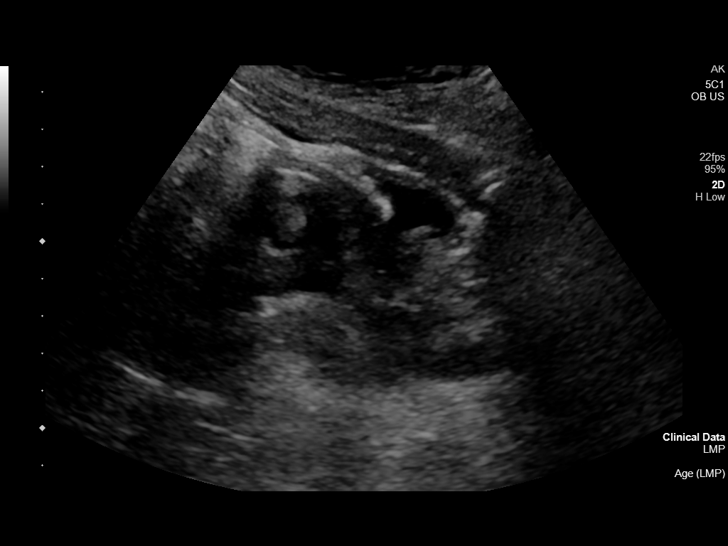
[im 4/31]
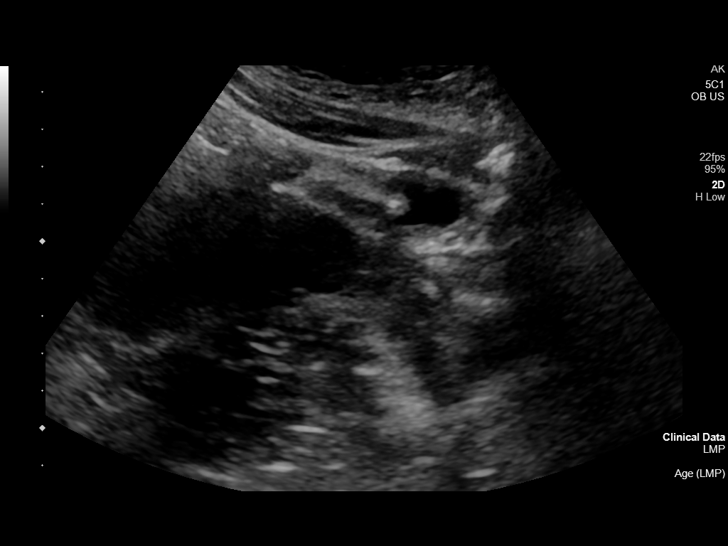
[im 6/31]
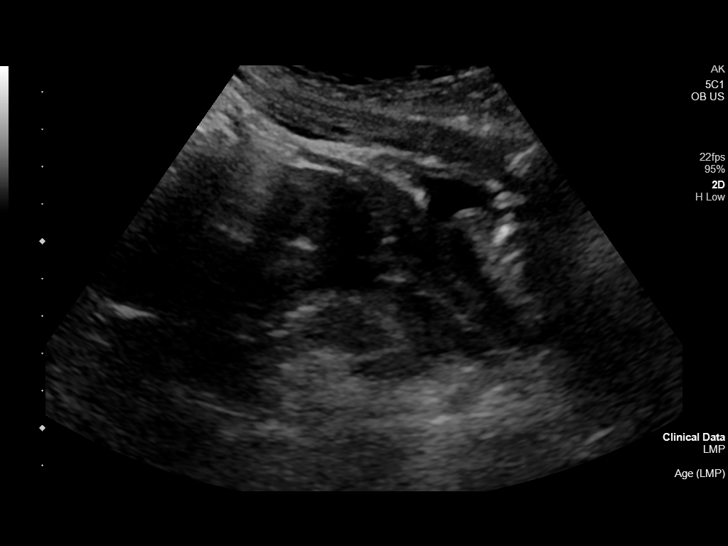
[im 8/31]
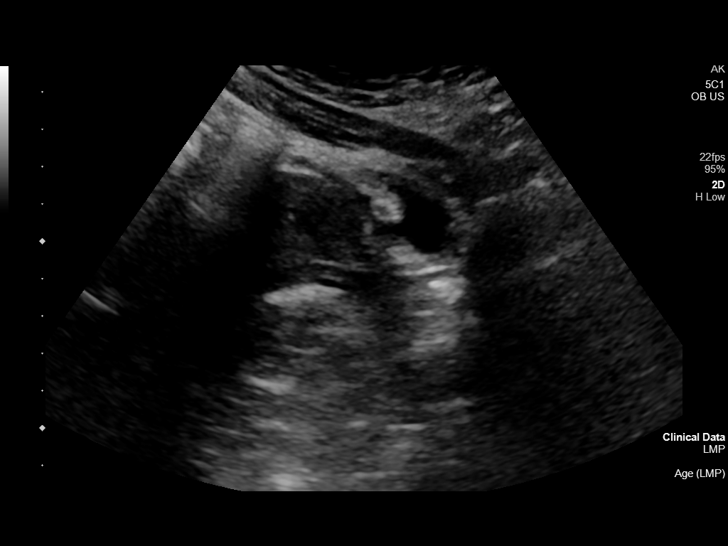
[im 11/31]
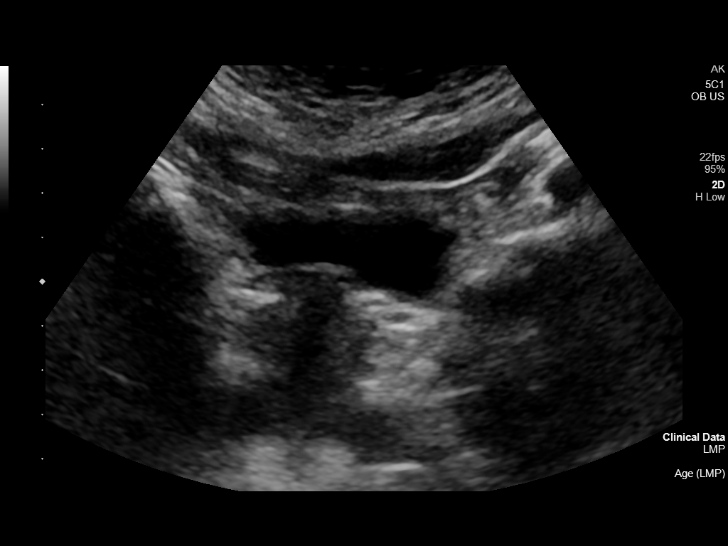
[im 13/31]
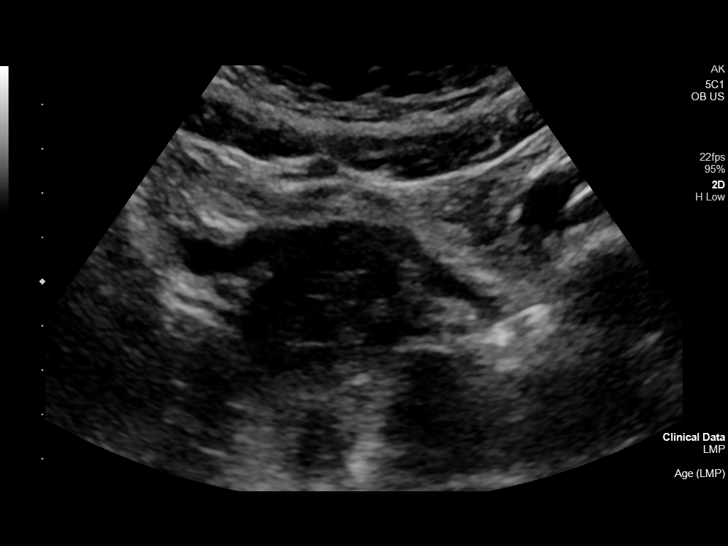
[im 15/31]
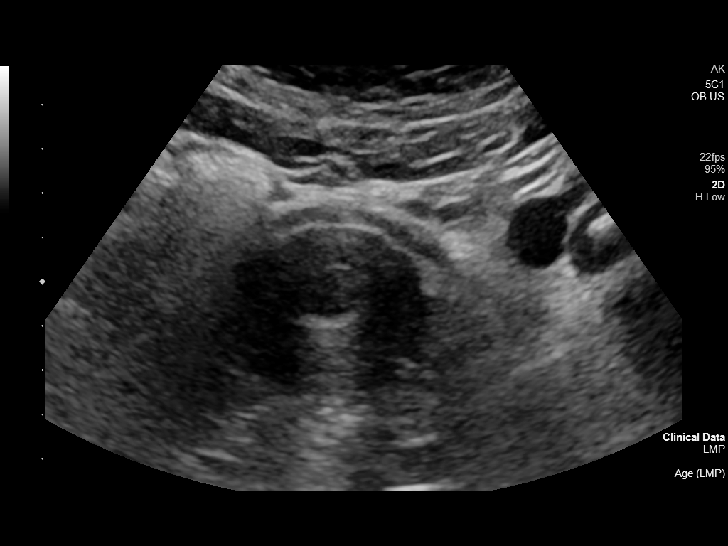
[im 17/31]
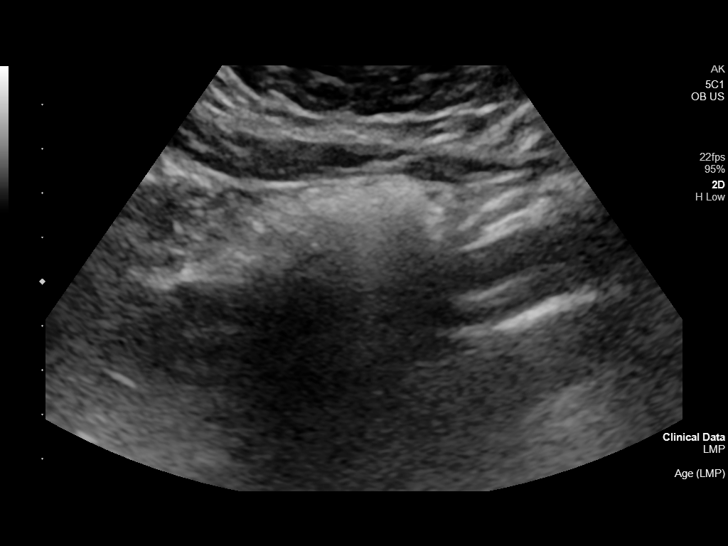
[im 19/31]
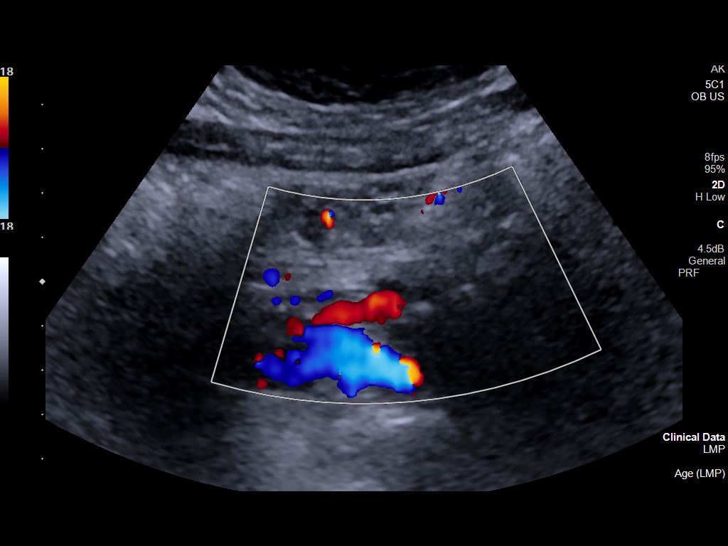
[im 22/31]
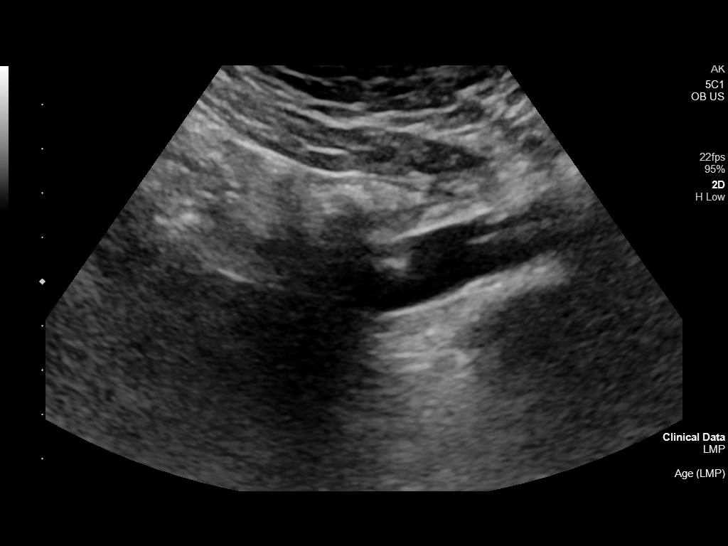
[im 24/31]
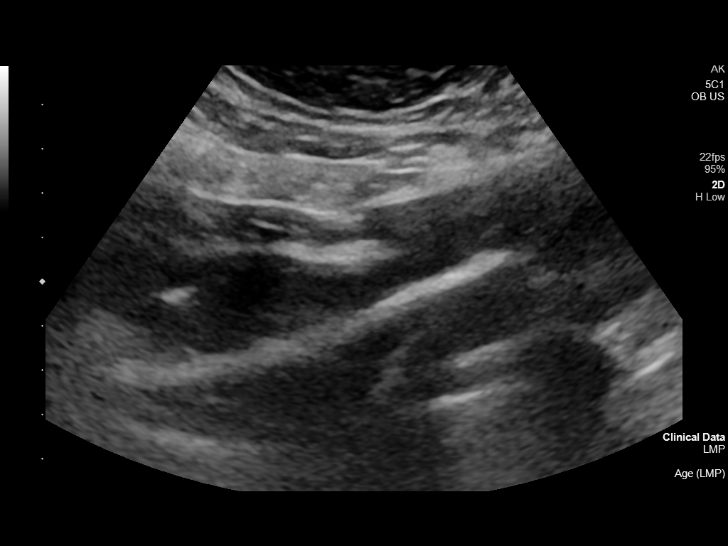
[im 26/31]
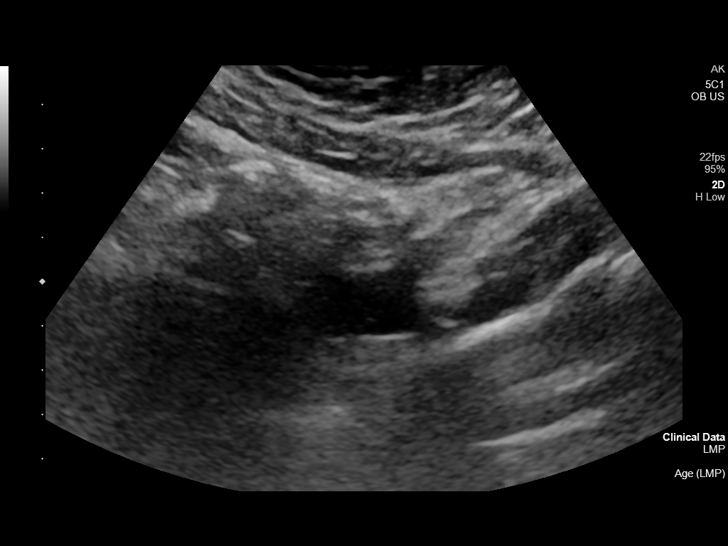
[im 28/31]
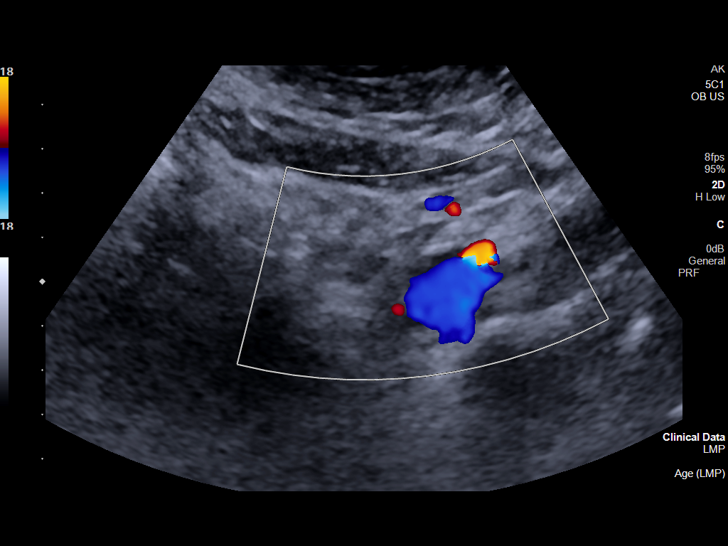
[im 31/31]
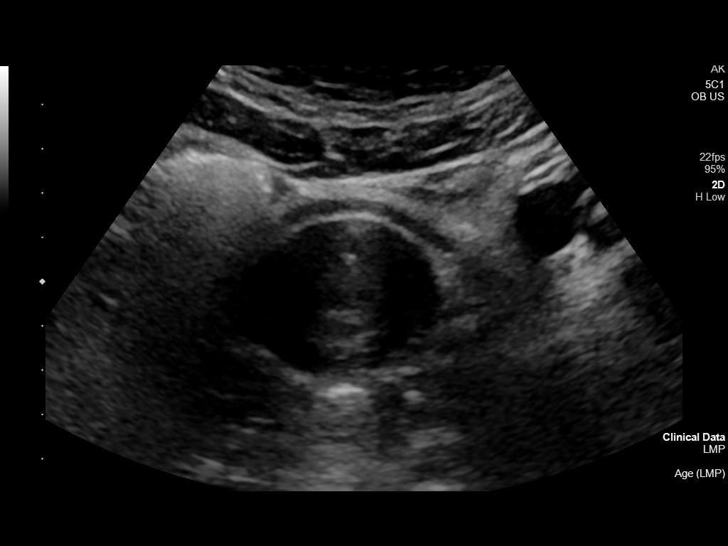

[14 of 28 positions shown; findings below may reference images not displayed]

FINDINGS: Intrauterine gestational sac: None

Yolk sac:  Not Visualized.

Embryo:  Not Visualized.

Cardiac Activity: Not Visualized.

Heart Rate:  bpm

MSD:    mm    w     d

CRL:     mm    w  d                  US EDC:

Subchorionic hemorrhage:  None visualized.

Maternal uterus/adnexae: Limited visualization. Ovaries not
visualized. No visible adnexal mass. Patient refused transvaginal
imaging.
IMPRESSION: Previously seen intrauterine gestation no longer visualized which
would be compatible with spontaneous abortion. However,
study/visualization is limited and patient refused transvaginal
imaging. Patient reports scheduled follow-up ultrasound at OB office
on [REDACTED].

## 2023-02-25 ENCOUNTER — Emergency Department (HOSPITAL_COMMUNITY): Admission: EM | Admit: 2023-02-25 | Payer: BC Managed Care – PPO | Source: Home / Self Care

## 2023-02-25 ENCOUNTER — Inpatient Hospital Stay (HOSPITAL_COMMUNITY)
Admission: AD | Admit: 2023-02-25 | Discharge: 2023-02-25 | Disposition: A | Discharge status: Home / Self Care | Payer: BC Managed Care – PPO | Attending: Obstetrics and Gynecology | Admitting: Obstetrics and Gynecology

## 2023-02-25 ENCOUNTER — Encounter (HOSPITAL_COMMUNITY): Payer: Self-pay | Admitting: Obstetrics and Gynecology

## 2023-02-25 DIAGNOSIS — O10912 Unspecified pre-existing hypertension complicating pregnancy, second trimester: Secondary | ICD-10-CM

## 2023-02-25 DIAGNOSIS — Z3A21 21 weeks gestation of pregnancy: Secondary | ICD-10-CM | POA: Insufficient documentation

## 2023-02-25 DIAGNOSIS — R519 Headache, unspecified: Secondary | ICD-10-CM | POA: Diagnosis not present

## 2023-02-25 DIAGNOSIS — O10919 Unspecified pre-existing hypertension complicating pregnancy, unspecified trimester: Secondary | ICD-10-CM

## 2023-02-25 LAB — PROTEIN / CREATININE RATIO, URINE
Creatinine, Urine: 114 mg/dL
Protein Creatinine Ratio: 0.08 mg/mg{Cre} (ref 0.00–0.15)
Total Protein, Urine: 9 mg/dL

## 2023-02-25 LAB — COMPREHENSIVE METABOLIC PANEL
ALT: 15 U/L (ref 0–44)
AST: 14 U/L — ABNORMAL LOW (ref 15–41)
Albumin: 3.2 g/dL — ABNORMAL LOW (ref 3.5–5.0)
Alkaline Phosphatase: 51 U/L (ref 38–126)
Anion gap: 10 (ref 5–15)
BUN: 7 mg/dL (ref 6–20)
CO2: 23 mmol/L (ref 22–32)
Calcium: 9.8 mg/dL (ref 8.9–10.3)
Chloride: 103 mmol/L (ref 98–111)
Creatinine, Ser: 0.6 mg/dL (ref 0.44–1.00)
GFR, Estimated: >60 mL/min (ref >60)
Glucose, Bld: 74 mg/dL (ref 70–99)
Potassium: 3.9 mmol/L (ref 3.5–5.1)
Sodium: 136 mmol/L (ref 135–145)
Total Bilirubin: 0.2 mg/dL — ABNORMAL LOW (ref 0.3–1.2)
Total Protein: 6.7 g/dL (ref 6.5–8.1)

## 2023-02-25 LAB — CBC WITH DIFFERENTIAL/PLATELET
Abs Immature Granulocytes: 0.14 10*3/uL — ABNORMAL HIGH (ref 0.00–0.07)
Basophils Absolute: 0.1 10*3/uL (ref 0.0–0.1)
Basophils Relative: 0 %
Eosinophils Absolute: 0 10*3/uL (ref 0.0–0.5)
Eosinophils Relative: 0 %
HCT: 39.7 % (ref 36.0–46.0)
Hemoglobin: 12.9 g/dL (ref 12.0–15.0)
Immature Granulocytes: 1 %
Lymphocytes Relative: 13 %
Lymphs Abs: 2 10*3/uL (ref 0.7–4.0)
MCH: 29.3 pg (ref 26.0–34.0)
MCHC: 32.5 g/dL (ref 30.0–36.0)
MCV: 90 fL (ref 80.0–100.0)
Monocytes Absolute: 1.1 10*3/uL — ABNORMAL HIGH (ref 0.1–1.0)
Monocytes Relative: 7 %
Neutro Abs: 11.8 10*3/uL — ABNORMAL HIGH (ref 1.7–7.7)
Neutrophils Relative %: 79 %
Platelets: 182 10*3/uL (ref 150–400)
RBC: 4.41 MIL/uL (ref 3.87–5.11)
RDW: 13.2 % (ref 11.5–15.5)
WBC: 15.1 10*3/uL — ABNORMAL HIGH (ref 4.0–10.5)
nRBC: 0 % (ref 0.0–0.2)

## 2023-02-25 LAB — URINALYSIS, ROUTINE W REFLEX MICROSCOPIC
Bilirubin Urine: NEGATIVE
Glucose, UA: NEGATIVE mg/dL
Hgb urine dipstick: NEGATIVE
Ketones, ur: NEGATIVE mg/dL
Leukocytes,Ua: NEGATIVE
Nitrite: NEGATIVE
Protein, ur: NEGATIVE mg/dL
Specific Gravity, Urine: 1.015 (ref 1.005–1.030)
pH: 7 (ref 5.0–8.0)

## 2023-02-25 MED ORDER — NIFEDIPINE ER OSMOTIC RELEASE 30 MG PO TB24: 30.0000 mg | ORAL_TABLET | Freq: Every day | ORAL | 1 refills | Status: DC

## 2023-02-25 MED ORDER — ACETAMINOPHEN 500 MG PO TABS
1000.0000 mg | ORAL_TABLET | Freq: Once | ORAL | Status: AC
  Administered 2023-02-25: 1000 mg via ORAL
  Filled 2023-02-25: qty 2

## 2023-02-25 MED ORDER — ASPIRIN 81 MG PO CHEW: 81.0000 mg | CHEWABLE_TABLET | Freq: Every day | ORAL | 3 refills | Status: DC

## 2023-02-25 NOTE — MAU Note (Signed)
Trula Frede is a 22 y.o. at [redacted]w[redacted]d here in MAU reporting: early this morning, she was at work- became hot, dizzy, started shaky, felt nauseous and threw up. Went home,  laid down.  A couple hours later, she started feeling light headed, started shaking and got a headache.    No bleeding, no abd pain. Feeling FM. Has eaten twice. Onset of complaint: 0930 Pain score: HA-3 Vitals:   02/25/23 1441  BP: (!) 150/84  Pulse: (!) 118  Resp: 17  Temp: 98.1 F (36.7 C)  SpO2: 100%     FHT:145 Lab orders placed from triage:  urine  BP and Pulse recheck: 148/84, P 95   Denies visual changes, epigastric pain or increase in swelling.  Reports hx of hypertension when she was younger, but 'it has been years"

## 2023-02-25 NOTE — MAU Provider Note (Signed)
History     CSN: [id number]  Arrival date and time: 02/25/23 1342   Event Date/Time   First Provider Initiated Contact with Patient 02/25/23 1541      Chief Complaint  Patient presents with   Dizziness   Headache   HPI  [name] is a 22 y.o. female G3P0020 [redacted]w[redacted]d here in MAU with complaints of Headache and dizziness. Hx of chronic hypertension "many years ago". She presented to urgent care with dizziness and lightheadedness. Symptoms started today. No syncope. Feels like she could drink more fluids.  She reports HA at the center of her forehead. She has not tried any typlenol or treatment for the HA. She currently reates her pain 3/10.  She is [facility].  Not on baby ASA    OB History     Gravida  3   Para  0   Term  0   Preterm      AB  2   Living         SAB  2   IAB      Ectopic      Multiple      Live Births              Past Medical History:  Diagnosis Date   Miscarriage    Panic attacks    RSV (respiratory syncytial virus infection)    6 mo old, admitted at [facility]     Past Surgical History:  Procedure Laterality Date   WISDOM TOOTH EXTRACTION  10/2021   [age];    Family History  Problem Relation Age of Onset   Hypertension Mother    Hypertension Father    Healthy Sister    Healthy Brother    Cancer Maternal Grandmother        breast   Heart disease Maternal Grandmother    Hypertension Maternal Grandmother    Heart disease Paternal Grandmother    Hypertension Paternal Grandmother    Cancer Paternal Grandfather        prostate unsure of age; and bone 58   Cancer Maternal Aunt        breast   Heart disease Maternal Aunt    Heart disease Paternal Uncle     Social History   Tobacco Use   Smoking status: Never   Smokeless tobacco: Never  Vaping Use   Vaping status: Never Used  Substance Use Topics   Alcohol use: No   Drug use: No    Allergies: No Known Allergies  Medications Prior  to Admission  Medication Sig Dispense Refill Last Dose   Prenatal Vit-Fe Fumarate-FA (PRENATAL PO) Take by mouth.   02/24/2023   sertraline (ZOLOFT) 50 MG tablet TAKE 1 TABLET BY MOUTH EVERY DAY 90 tablet 0 02/24/2023    Results for orders placed or performed during the hospital encounter of 02/25/23 (from the past 48 hour(s))  Urinalysis, Routine w reflex microscopic -Urine, Clean Catch     Status: Abnormal   Collection Time: 02/25/23  3:13 PM  Result Value Ref Range   Color, Urine YELLOW YELLOW   APPearance HAZY (A) CLEAR   Specific Gravity, Urine 1.015 1.005 - 1.030   pH 7.0 5.0 - 8.0   Glucose, UA NEGATIVE NEGATIVE mg/dL   Hgb urine dipstick NEGATIVE NEGATIVE   Bilirubin Urine NEGATIVE NEGATIVE   Ketones, ur NEGATIVE NEGATIVE mg/dL   Protein, ur NEGATIVE NEGATIVE mg/dL   Nitrite NEGATIVE NEGATIVE   Leukocytes,Ua NEGATIVE NEGATIVE  Comment: Performed at [facility], [address], [place], [place] [address]  CBC with Differential/Platelet     Status: Abnormal   Collection Time: 02/25/23  4:33 PM  Result Value Ref Range   WBC 15.1 (H) 4.0 - 10.5 K/uL   RBC 4.41 3.87 - 5.11 MIL/uL   Hemoglobin 12.9 12.0 - 15.0 g/dL   HCT 34.7 42.5 - 95.6 %   MCV 90.0 80.0 - 100.0 fL   MCH 29.3 26.0 - 34.0 pg   MCHC 32.5 30.0 - 36.0 g/dL   RDW 38.7 56.4 - 33.2 %   Platelets 182 150 - 400 K/uL   nRBC 0.0 0.0 - 0.2 %   Neutrophils Relative % 79 %   Neutro Abs 11.8 (H) 1.7 - 7.7 K/uL   Lymphocytes Relative 13 %   Lymphs Abs 2.0 0.7 - 4.0 K/uL   Monocytes Relative 7 %   Monocytes Absolute 1.1 (H) 0.1 - 1.0 K/uL   Eosinophils Relative 0 %   Eosinophils Absolute 0.0 0.0 - 0.5 K/uL   Basophils Relative 0 %   Basophils Absolute 0.1 0.0 - 0.1 K/uL   Immature Granulocytes 1 %   Abs Immature Granulocytes 0.14 (H) 0.00 - 0.07 K/uL    Comment: Performed at [facility], [address] [place], [place] [address]    Review of Systems  Eyes:  Negative for photophobia and visual  disturbance.  Gastrointestinal:  Positive for abdominal pain.  Neurological:  Positive for headaches.   Physical Exam   Blood pressure (!) 150/84, pulse (!) 118, temperature 98.1 F (36.7 C), temperature source Oral, resp. rate 17, height 5' 1.5" (1.562 m), weight 74 kg, last menstrual period 09/29/2022, SpO2 100%, unknown if currently breastfeeding.  Patient Vitals for the past 24 hrs:  BP Temp Temp src Pulse Resp SpO2 Height Weight  02/25/23 1630 131/64 -- -- 84 -- -- -- --  02/25/23 1615 131/67 -- -- 83 -- -- -- --  02/25/23 1601 125/75 -- -- (!) 117 -- -- -- --  02/25/23 1600 (!) 156/81 -- -- (!) 112 -- 100 % -- --  02/25/23 1558 136/65 -- -- 90 -- -- -- --  02/25/23 1556 133/67 -- -- 78 -- -- -- --  02/25/23 1555 129/66 -- -- 79 -- 100 % -- --  02/25/23 1554 -- -- -- -- -- 100 % -- --  02/25/23 1545 134/80 -- -- (!) 103 -- -- -- --  02/25/23 1441 (!) 150/84 98.1 F (36.7 C) Oral (!) 118 17 100 % 5' 1.5" (1.562 m) 74 kg    Physical Exam Constitutional:      General: She is not in acute distress.    Appearance: She is well-developed. She is not ill-appearing, toxic-appearing or diaphoretic.  Pulmonary:     Effort: Pulmonary effort is normal.  Neurological:     Mental Status: She is alert and oriented to person, place, and time.  Psychiatric:        Behavior: Behavior normal.    MAU Course  Procedures  MDM  + fetal heart tones. Several elevated BP's noted within the [facility] system. Consistent with [facility] BP readings here today. Orthostatic vitals normal.  Headache 0/10 following 1 gram of tylenol.   Assessment and Plan    A:  1. Chronic hypertension affecting pregnancy   2. [redacted] weeks gestation of pregnancy   3. Acute nonintractable headache, unspecified headache type      P:  Dc  home Rx: Nifedipine, ASA She will need antenatal testing at 32 weeks with Growth [place] Q4 weeks. Keep your follow up OB appointment Return to MAU if symptoms worsen Pre E  precautions Purchase a home BP cuff. Check It once a week and keep a log to review with you OB providers  Nilani Hugill, Harolyn Rutherford, NP 02/25/2023 5:41 PM

## 2023-03-01 ENCOUNTER — Ambulatory Visit (INDEPENDENT_AMBULATORY_CARE_PROVIDER_SITE_OTHER): Payer: BC Managed Care – PPO | Admitting: Certified Nurse Midwife

## 2023-03-01 VITALS — BP 108/67 | HR 77 | Wt 165.0 lb

## 2023-03-01 DIAGNOSIS — O10919 Unspecified pre-existing hypertension complicating pregnancy, unspecified trimester: Secondary | ICD-10-CM

## 2023-03-01 DIAGNOSIS — Z3A21 21 weeks gestation of pregnancy: Secondary | ICD-10-CM

## 2023-03-01 DIAGNOSIS — O0992 Supervision of high risk pregnancy, unspecified, second trimester: Secondary | ICD-10-CM

## 2023-03-01 NOTE — Progress Notes (Signed)
   PRENATAL VISIT NOTE  Subjective:  Kristi Bauer is a 22 y.o. G3P0020 at [redacted]w[redacted]d being seen today for ongoing prenatal care.  She is currently monitored for the following issues for this high-risk pregnancy and has Altered mental status; Depression, major, single episode, mild (HCC); Flank pain; Pregnancy, location unknown; Black stool; and Supervision of other normal pregnancy, antepartum on their problem list.  Patient reports  she had an episode while at work of dizziness, headache, shaky & weak then vomited. She went home from work and rested, episode occurred again and she went to [facility]U in [place] .  While in [facility]U her BP was elevated to 150s/90s, she reports remote history (elementary school) of hypertension and she was therefore diagnosed with CHTN complicating pregnancy during MAU visit & started on procardia. Denies recurrence of symptoms.  Labs reviewed & normal including UP/C.  Contractions: Not present. Vag. Bleeding: None.  Movement: Present. Denies leaking of fluid.   The following portions of the patient's history were reviewed and updated as appropriate: allergies, current medications, past family history, past medical history, past social history, past surgical history and problem list.   Objective:   Vitals:   03/01/23 0815  BP: 108/67  Pulse: 77  Weight: 165 lb (74.8 kg)   Total weight gain: 23 lb (10.4 kg) Fetal Status: Fetal Heart Rate (bpm): 140   Movement: Present      General:  Alert, oriented and cooperative. Patient is in no acute distress.  Skin: Skin is warm and dry. No rash noted.   Cardiovascular: Normal heart rate noted  Respiratory: Normal respiratory effort, no problems with respiration noted  Abdomen: Soft, gravid, appropriate for gestational age.  Pain/Pressure: Absent     Pelvic: Cervical exam deferred        Extremities: Normal range of motion.     Mental Status: Normal mood and affect. Normal behavior. Normal judgment and thought content.    Assessment and Plan:  Pregnancy: G3P0020 at [redacted]w[redacted]d 1. Supervision of high-risk pregnancy, second trimester  2. Chronic hypertension affecting pregnancy  3. [redacted] weeks gestation of pregnancy Continue daily ASA & procardia, bring BP cuff for correlation to next visit. Encouraged increased protein with snacks given symptoms may be associated with low blood sugar.  Preterm labor symptoms and general obstetric precautions including but not limited to vaginal bleeding, contractions, leaking of fluid and fetal movement were reviewed in detail with the patient. Please refer to After Visit Summary for other counseling recommendations.   Return in about 2 weeks (around 03/15/2023) for ROB.  Future Appointments  Date Time Provider Department Center  03/19/2023 10:15 AM [facility] [place] 1 [facility] None  03/19/2023 11:15 AM Glenetta Borg, CNM [facility] None    [name], CNM

## 2023-03-19 ENCOUNTER — Ambulatory Visit: Payer: BC Managed Care – PPO

## 2023-03-19 ENCOUNTER — Encounter: Payer: Self-pay | Admitting: Obstetrics

## 2023-03-19 ENCOUNTER — Ambulatory Visit (INDEPENDENT_AMBULATORY_CARE_PROVIDER_SITE_OTHER): Payer: BC Managed Care – PPO | Admitting: Obstetrics

## 2023-03-19 VITALS — BP 134/76 | HR 82 | Wt 170.0 lb

## 2023-03-19 DIAGNOSIS — Z362 Encounter for other antenatal screening follow-up: Secondary | ICD-10-CM

## 2023-03-19 DIAGNOSIS — Z3A24 24 weeks gestation of pregnancy: Secondary | ICD-10-CM

## 2023-03-19 DIAGNOSIS — O10919 Unspecified pre-existing hypertension complicating pregnancy, unspecified trimester: Secondary | ICD-10-CM | POA: Insufficient documentation

## 2023-03-19 DIAGNOSIS — O10913 Unspecified pre-existing hypertension complicating pregnancy, third trimester: Secondary | ICD-10-CM

## 2023-03-19 DIAGNOSIS — Z3480 Encounter for supervision of other normal pregnancy, unspecified trimester: Secondary | ICD-10-CM

## 2023-03-19 DIAGNOSIS — O10012 Pre-existing essential hypertension complicating pregnancy, second trimester: Secondary | ICD-10-CM

## 2023-03-19 HISTORY — DX: Unspecified pre-existing hypertension complicating pregnancy, third trimester: O10.913

## 2023-03-19 MED ORDER — BLOOD PRESSURE KIT KIT
PACK | 0 refills | Status: DC
Start: 2023-03-19 — End: 2024-05-25

## 2023-03-19 NOTE — Assessment & Plan Note (Addendum)
-  Taking Procardia XL 30 mg daily -Reviewed s/s of severe HTN -Discussed growth scan in 3rd trimester -Brought home BP cuff (wrist cuff) for calibration today. Reading varied by >10 points from office cuff. Will send in rx for standard home BP cuff.

## 2023-03-19 NOTE — Addendum Note (Signed)
Addended by: Loney Laurence on: 03/19/2023 03:54 PM   Modules accepted: Orders

## 2023-03-19 NOTE — Progress Notes (Signed)
    Return Prenatal Note   Assessment/Plan   Plan  22 y.o. G3P0020 at [redacted]w[redacted]d presents for follow-up OB visit. Reviewed prenatal record including previous visit note.  Chronic hypertension affecting pregnancy -Taking Procardia XL 30 mg daily -Reviewed s/s of severe HTN -Discussed growth scan in 3rd trimester -Brought home BP cuff (wrist cuff) for calibration today. Reading varied by >10 points from office cuff. Will send in rx for standard home BP cuff.  Supervision of other normal pregnancy, antepartum -Signed up for CBE -Discussed normal discomforts of pregnancy -Reviewed s/s of PTL -Prepared for 28-week labs at next visit -F/u US today - anatomy now complete    No orders of the defined types were placed in this encounter.  Return in about 4 weeks (around 04/16/2023).   Future Appointments  Date Time Provider Department Center  04/19/2023  8:20 AM [facility] None  04/19/2023  8:55 AM Free, Lindalou Hose, CNM [facility] None     For next visit:  ROB with 1 hour gluocla, third trimester labs, and Tdap     Subjective   21 y.o. [id number] at [redacted]w[redacted]d presents for this follow-up prenatal visit.  Kristi Bauer is feeling well. She is feeling the baby move. She is here with her partner and mom, who both plan to attend the birth. She has been taking Procardia daily. She has had mild headaches a few times a week. Her mood is stable.  Movement: Present Contractions: Not present  Objective   Flow sheet Vitals: Pulse Rate: 82 BP: 134/76 Fundal Height: 25 cm Fetal Heart Rate (bpm): 136 Total weight gain: 28 lb (12.7 kg)  General Appearance  No acute distress, well appearing, and well nourished Pulmonary   Normal work of breathing Neurologic   Alert and oriented to person, place, and time Psychiatric   Mood and affect within normal limits  Kristi Bauer, CNM 03/19/23 11:31 AM

## 2023-03-19 NOTE — Assessment & Plan Note (Signed)
-  Signed up for CBE -Discussed normal discomforts of pregnancy -Reviewed s/s of PTL -Prepared for 28-week labs at next visit -F/u US today - anatomy now complete

## 2023-04-16 DIAGNOSIS — Z2839 Other underimmunization status: Secondary | ICD-10-CM

## 2023-04-16 DIAGNOSIS — O09899 Supervision of other high risk pregnancies, unspecified trimester: Secondary | ICD-10-CM | POA: Insufficient documentation

## 2023-04-16 HISTORY — DX: Other underimmunization status: Z28.39

## 2023-04-19 ENCOUNTER — Other Ambulatory Visit: Payer: Self-pay

## 2023-04-19 ENCOUNTER — Other Ambulatory Visit: Payer: BC Managed Care – PPO

## 2023-04-19 ENCOUNTER — Ambulatory Visit (INDEPENDENT_AMBULATORY_CARE_PROVIDER_SITE_OTHER): Payer: BC Managed Care – PPO

## 2023-04-19 VITALS — BP 111/67 | HR 77 | Wt 175.6 lb

## 2023-04-19 DIAGNOSIS — O09899 Supervision of other high risk pregnancies, unspecified trimester: Secondary | ICD-10-CM

## 2023-04-19 DIAGNOSIS — D649 Anemia, unspecified: Secondary | ICD-10-CM

## 2023-04-19 DIAGNOSIS — Z3480 Encounter for supervision of other normal pregnancy, unspecified trimester: Secondary | ICD-10-CM

## 2023-04-19 DIAGNOSIS — Z23 Encounter for immunization: Secondary | ICD-10-CM | POA: Diagnosis not present

## 2023-04-19 DIAGNOSIS — Z131 Encounter for screening for diabetes mellitus: Secondary | ICD-10-CM

## 2023-04-19 DIAGNOSIS — Z113 Encounter for screening for infections with a predominantly sexual mode of transmission: Secondary | ICD-10-CM

## 2023-04-19 DIAGNOSIS — O10013 Pre-existing essential hypertension complicating pregnancy, third trimester: Secondary | ICD-10-CM

## 2023-04-19 DIAGNOSIS — Z3A28 28 weeks gestation of pregnancy: Secondary | ICD-10-CM

## 2023-04-19 DIAGNOSIS — O10919 Unspecified pre-existing hypertension complicating pregnancy, unspecified trimester: Secondary | ICD-10-CM

## 2023-04-19 NOTE — Progress Notes (Signed)
    Return Prenatal Note   Assessment/Plan   Plan  22 y.o. G3P0020 at [redacted]w[redacted]d presents for follow-up OB visit. Reviewed prenatal record including previous visit note.  Chronic hypertension affecting pregnancy - Reviewed plan for growth ultrasound (ordered today) and NSTs starting at 32 weeks.   Supervision of other normal pregnancy, antepartum - Third trimester labs, one hour glucola, Tdap administered, and BTFC signed today. - Sample birth plan given today. - Reviewed kick counts and preterm labor warning signs. Instructed to call office or come to hospital with persistent headache, vision changes, regular contractions, leaking of fluid, decreased fetal movement or vaginal bleeding.   Orders Placed This Encounter  Procedures   US OB Follow Up    Standing Status:   Future    Standing Expiration Date:   07/19/2023    Order Specific Question:   Reason for exam:    Answer:   gorwth ultrasound due to cHTN    Order Specific Question:   Preferred imaging location?    Answer:   Internal   Tdap vaccine greater than or equal to 7yo IM   Return in about 2 weeks (around 05/03/2023) for ROB.   No future appointments.   For next visit:   ROB and growth ultrasound     Subjective   21 y.o. [id number] at [redacted]w[redacted]d presents for this follow-up prenatal visit.  Patient has no concerns.  Patient reports: Movement: Present Contractions: Irritability  Objective   Flow sheet Vitals: Pulse Rate: 77 BP: 111/67 Fundal Height: 28 cm Fetal Heart Rate (bpm): 125 Total weight gain: 33 lb 9.6 oz (15.2 kg)  General Appearance  No acute distress, well appearing, and well nourished Pulmonary   Normal work of breathing Neurologic   Alert and oriented to person, place, and time Psychiatric   Mood and affect within normal limits  Lindalou Hose Julitza Rickles, CNM  09/16/248:56 AM

## 2023-04-19 NOTE — Assessment & Plan Note (Addendum)
-   Third trimester labs, one hour glucola, Tdap administered, and BTFC signed today. - Sample birth plan given today. - Reviewed kick counts and preterm labor warning signs. Instructed to call office or come to hospital with persistent headache, vision changes, regular contractions, leaking of fluid, decreased fetal movement or vaginal bleeding.

## 2023-04-19 NOTE — Assessment & Plan Note (Signed)
-   Reviewed plan for growth ultrasound (ordered today) and NSTs starting at 32 weeks.

## 2023-04-20 LAB — 28 WEEK RH+PANEL
Basophils Absolute: 0.1 10*3/uL (ref 0.0–0.2)
Basos: 0 %
EOS (ABSOLUTE): 0.1 10*3/uL (ref 0.0–0.4)
Eos: 1 %
Gestational Diabetes Screen: 108 mg/dL (ref 70–139)
HIV Screen 4th Generation wRfx: NONREACTIVE
Hematocrit: 39.6 % (ref 34.0–46.6)
Hemoglobin: 12.6 g/dL (ref 11.1–15.9)
Immature Grans (Abs): 0.1 10*3/uL (ref 0.0–0.1)
Immature Granulocytes: 1 %
Lymphocytes Absolute: 2 10*3/uL (ref 0.7–3.1)
Lymphs: 15 %
MCH: 29.9 pg (ref 26.6–33.0)
MCHC: 31.8 g/dL (ref 31.5–35.7)
MCV: 94 fL (ref 79–97)
Monocytes Absolute: 1 10*3/uL — ABNORMAL HIGH (ref 0.1–0.9)
Monocytes: 8 %
Neutrophils Absolute: 9.8 10*3/uL — ABNORMAL HIGH (ref 1.4–7.0)
Neutrophils: 75 %
Platelets: 167 10*3/uL (ref 150–450)
RBC: 4.21 x10E6/uL (ref 3.77–5.28)
RDW: 11.9 % (ref 11.7–15.4)
RPR Ser Ql: NONREACTIVE
WBC: 13.1 10*3/uL — ABNORMAL HIGH (ref 3.4–10.8)

## 2023-04-23 ENCOUNTER — Encounter: Payer: Self-pay | Admitting: Obstetrics

## 2023-05-01 ENCOUNTER — Other Ambulatory Visit: Payer: Self-pay | Admitting: Obstetrics and Gynecology

## 2023-05-01 DIAGNOSIS — F418 Other specified anxiety disorders: Secondary | ICD-10-CM

## 2023-05-03 MED ORDER — SERTRALINE HCL 50 MG PO TABS
50.0000 mg | ORAL_TABLET | Freq: Every day | ORAL | 0 refills | Status: DC
Start: 2023-05-03 — End: 2023-06-16

## 2023-05-03 NOTE — Telephone Encounter (Signed)
Spoke with patient regarding her wanting to increase the dosage. Recommended she speak with Noelle Penner, CNM at her upcoming appt about this. Refill sent in for current dosage.

## 2023-05-05 ENCOUNTER — Ambulatory Visit (INDEPENDENT_AMBULATORY_CARE_PROVIDER_SITE_OTHER): Payer: BC Managed Care – PPO

## 2023-05-05 ENCOUNTER — Ambulatory Visit: Payer: BC Managed Care – PPO

## 2023-05-05 VITALS — BP 137/83 | HR 102 | Wt 180.0 lb

## 2023-05-05 DIAGNOSIS — Z3A31 31 weeks gestation of pregnancy: Secondary | ICD-10-CM

## 2023-05-05 DIAGNOSIS — O10013 Pre-existing essential hypertension complicating pregnancy, third trimester: Secondary | ICD-10-CM

## 2023-05-05 DIAGNOSIS — O10919 Unspecified pre-existing hypertension complicating pregnancy, unspecified trimester: Secondary | ICD-10-CM

## 2023-05-05 DIAGNOSIS — Z3480 Encounter for supervision of other normal pregnancy, unspecified trimester: Secondary | ICD-10-CM

## 2023-05-05 NOTE — Assessment & Plan Note (Signed)
-   Normal growth [place] today. AFI 15, EFW 51.9%. - Initial elevated BP, normotensive on recheck. Has not taken Procardia today. Rx for BP cuff provided.  - Weekly NSTs starting next week.

## 2023-05-05 NOTE — Progress Notes (Signed)
    Return Prenatal Note   Assessment/Plan   Plan  22 y.o. G3P0020 at [redacted]w[redacted]d presents for follow-up OB visit. Reviewed prenatal record including previous visit note.  Chronic hypertension affecting pregnancy - Normal growth [place] today. AFI 15, EFW 51.9%. - Initial elevated BP, normotensive on recheck. Has not taken Procardia today. Rx for BP cuff provided.  - Weekly NSTs starting next week.   Supervision of other normal pregnancy, antepartum - Will get flu vaccine through work. Desires RSV vaccine at next visit.  - Reviewed kick counts and preterm labor warning signs. Instructed to call office or come to hospital with persistent headache, vision changes, regular contractions, leaking of fluid, decreased fetal movement or vaginal bleeding.   Orders Placed This Encounter  Procedures   Fetal nonstress test    Standing Status:   Standing    Number of Occurrences:   8    Standing Expiration Date:   05/04/2024   Return in about 2 weeks (around 05/19/2023) for ROB.   No future appointments.  For next visit:   NST in one week, ROB in 2 weeks     Subjective   22 y.o. [id number] at [redacted]w[redacted]d presents for this follow-up prenatal visit.  Patient has no concerns.  Patient reports: Movement: Present Contractions: Not present  Objective   Flow sheet Vitals: Pulse Rate: (!) 102 BP: 137/83 Fundal Height: 31 cm Fetal Heart Rate (bpm): 131 (by [place]) Presentation: Vertex Total weight gain: 38 lb (17.2 kg)  General Appearance  No acute distress, well appearing, and well nourished Pulmonary   Normal work of breathing Neurologic   Alert and oriented to person, place, and time Psychiatric   Mood and affect within normal limits  Lindalou Hose Mckoy Bhakta, CNM  05/04/2410:26 AM

## 2023-05-05 NOTE — Assessment & Plan Note (Addendum)
-   Will get flu vaccine through work. Desires RSV vaccine at next visit.  - Reviewed kick counts and preterm labor warning signs. Instructed to call office or come to hospital with persistent headache, vision changes, regular contractions, leaking of fluid, decreased fetal movement or vaginal bleeding.

## 2023-05-10 ENCOUNTER — Encounter: Payer: Self-pay | Admitting: Obstetrics

## 2023-05-10 ENCOUNTER — Other Ambulatory Visit: Payer: Self-pay

## 2023-05-10 ENCOUNTER — Telehealth: Payer: Self-pay

## 2023-05-10 ENCOUNTER — Observation Stay
Admission: EM | Admit: 2023-05-10 | Discharge: 2023-05-10 | Disposition: A | Discharge status: Home / Self Care | Payer: BC Managed Care – PPO

## 2023-05-10 DIAGNOSIS — R519 Headache, unspecified: Secondary | ICD-10-CM | POA: Diagnosis present

## 2023-05-10 DIAGNOSIS — O10013 Pre-existing essential hypertension complicating pregnancy, third trimester: Principal | ICD-10-CM | POA: Insufficient documentation

## 2023-05-10 DIAGNOSIS — O99891 Other specified diseases and conditions complicating pregnancy: Secondary | ICD-10-CM | POA: Diagnosis not present

## 2023-05-10 DIAGNOSIS — Z3A31 31 weeks gestation of pregnancy: Secondary | ICD-10-CM | POA: Insufficient documentation

## 2023-05-10 DIAGNOSIS — O1203 Gestational edema, third trimester: Secondary | ICD-10-CM | POA: Diagnosis not present

## 2023-05-10 DIAGNOSIS — Z3480 Encounter for supervision of other normal pregnancy, unspecified trimester: Principal | ICD-10-CM

## 2023-05-10 LAB — COMPREHENSIVE METABOLIC PANEL
ALT: 17 U/L (ref 0–44)
AST: 16 U/L (ref 15–41)
Albumin: 3.1 g/dL — ABNORMAL LOW (ref 3.5–5.0)
Alkaline Phosphatase: 119 U/L (ref 38–126)
Anion gap: 6 (ref 5–15)
BUN: 10 mg/dL (ref 6–20)
CO2: 22 mmol/L (ref 22–32)
Calcium: 9.5 mg/dL (ref 8.9–10.3)
Chloride: 105 mmol/L (ref 98–111)
Creatinine, Ser: 0.61 mg/dL (ref 0.44–1.00)
GFR, Estimated: >60 mL/min (ref >60)
Glucose, Bld: 77 mg/dL (ref 70–99)
Potassium: 3.7 mmol/L (ref 3.5–5.1)
Sodium: 133 mmol/L — ABNORMAL LOW (ref 135–145)
Total Bilirubin: 0.4 mg/dL (ref 0.3–1.2)
Total Protein: 6.9 g/dL (ref 6.5–8.1)

## 2023-05-10 LAB — CBC
HCT: 37.3 % (ref 36.0–46.0)
Hemoglobin: 12.5 g/dL (ref 12.0–15.0)
MCH: 29.7 pg (ref 26.0–34.0)
MCHC: 33.5 g/dL (ref 30.0–36.0)
MCV: 88.6 fL (ref 80.0–100.0)
Platelets: 165 10*3/uL (ref 150–400)
RBC: 4.21 MIL/uL (ref 3.87–5.11)
RDW: 12.2 % (ref 11.5–15.5)
WBC: 15.4 10*3/uL — ABNORMAL HIGH (ref 4.0–10.5)
nRBC: 0 % (ref 0.0–0.2)

## 2023-05-10 LAB — PROTEIN / CREATININE RATIO, URINE
Creatinine, Urine: 39 mg/dL
Total Protein, Urine: <6 mg/dL

## 2023-05-10 MED ORDER — ACETAMINOPHEN 500 MG PO TABS: 1000.0000 mg | ORAL_TABLET | Freq: Four times a day (QID) | ORAL | Status: DC | PRN

## 2023-05-10 MED ORDER — ACETAMINOPHEN 500 MG PO TABS
1000.0000 mg | ORAL_TABLET | Freq: Once | ORAL | Status: AC
  Administered 2023-05-10: 1000 mg via ORAL
  Filled 2023-05-10: qty 2

## 2023-05-10 MED ORDER — METOCLOPRAMIDE HCL 10 MG PO TABS: 10.0000 mg | ORAL_TABLET | Freq: Four times a day (QID) | ORAL | 1 refills | Status: DC | PRN

## 2023-05-10 MED ORDER — MAGNESIUM OXIDE -MG SUPPLEMENT 400 (240 MG) MG PO TABS
800.0000 mg | ORAL_TABLET | Freq: Once | ORAL | Status: AC
  Administered 2023-05-10: 800 mg via ORAL
  Filled 2023-05-10: qty 2

## 2023-05-10 MED ORDER — METOCLOPRAMIDE HCL 10 MG PO TABS
10.0000 mg | ORAL_TABLET | Freq: Once | ORAL | Status: AC
  Administered 2023-05-10: 10 mg via ORAL
  Filled 2023-05-10: qty 1

## 2023-05-10 NOTE — Telephone Encounter (Signed)
Patient call in today to report 3 day on going headache along with a BP of 137/97. Asked about swelling, states swelling in feet. Reports no salty foods and continuing to take medication. Advised for patient to go to L&D to preeclamptic work-up, patient states understanding and will go now.

## 2023-05-10 NOTE — OB Triage Note (Signed)
Pt reports to labor and delivery with complaints of elevated blood pressure - at home 137/97. Pt states that she has had a headache off and on since Friday. States that she was seeing floaters on Saturday. She took tylenol for her headache - started at 6/10 and now 3/10.   Pt states that she had high blood pressure in middle school. It resolved and has come back during pregnancy. She is taking procardia 30mg  XL.   Pt denies contractions, LOF, and vaginal bleeding. Pt states positive fetal movement.  She has back and hip pain with the advancement of pregnancy. Throbbing sensation.    EFM applied and assessing.

## 2023-05-10 NOTE — OB Triage Note (Signed)

## 2023-05-10 NOTE — OB Triage Note (Signed)
LABOR & DELIVERY OB TRIAGE NOTE  SUBJECTIVE  HPI Kristi Bauer is a 22 y.o. G3P0020 at [redacted]w[redacted]d who presents to Labor & Delivery for evaluation of headache and swelling. Her pregnancy is complicated by chronic hypertension managed with medication.  Patient first developed headache on Friday of this past week. She took one dose of Tylenol on Saturday without relief and a second dose earlier today that did provide relief. Prior to taking Tylenol today, her headache was a 6/10 on the pain scale and was then reduced to a 3/10. Her blood pressure at home was slightly elevated in the 130s/90s. She is currently taking 30 mg daily nifedipine. She has also noted increased swelling in her lower extremities.  OB History     Gravida  3   Para  0   Term  0   Preterm      AB  2   Living         SAB  2   IAB      Ectopic      Multiple      Live Births              Scheduled Meds:  acetaminophen  1,000 mg Oral Once   magnesium oxide  800 mg Oral Once   metoCLOPramide  10 mg Oral Once   Continuous Infusions: none PRN Meds:.none  OBJECTIVE  BP 123/73   Pulse 95   Temp 98.3 F (36.8 C) (Oral)   Resp 16   Ht 5' 1.5" (1.562 m)   Wt 81.6 kg   LMP 09/29/2022 (Exact Date)   BMI 33.46 kg/m   General: AOx4 Heart: normal heart rate Lungs: normal work of breathing Abdomen: gravid, non-tender Cervical exam:   deferred  NST I reviewed the NST and it was reactive.  Baseline: 135 Variability: moderate Accelerations: present (15x15) Decelerations:none Toco: irregular Category I  ASSESSMENT Impression  1) Pregnancy at G3P0020, [redacted]w[redacted]d, Estimated Date of Delivery: 07/06/23 2) Reassuring maternal/fetal status, reactive NST 3) Headache in pregnancy, improving with headache cocktail administered in triage. 4) Chronic hypertension on medication, PIH labs WNL. Initial blood pressure 140/74 on presentation to triage, normotensive on recheck.  PLAN 1) Reviewed reassuring fetal  status and normal PIH labs. 2) Headache treated with headache cocktail. Provided with Reglan prescription and headache cocktail instructions. Reviewed comfort measures for swelling in pregnancy.  3) Continue with current dose of nifedipine and continue to monitor blood pressure. Preeclampsia precautions reviewed, continue with already schedule antenatal surveillance and routine prenatal care. 4) Discharged in stable condition.   Lindalou Hose Quincy Prisco, CNM  05/10/23  5:23 PM

## 2023-05-12 ENCOUNTER — Ambulatory Visit: Payer: BC Managed Care – PPO

## 2023-05-12 VITALS — BP 112/65 | HR 78 | Ht 61.5 in | Wt 182.6 lb

## 2023-05-12 DIAGNOSIS — O10919 Unspecified pre-existing hypertension complicating pregnancy, unspecified trimester: Secondary | ICD-10-CM

## 2023-05-12 DIAGNOSIS — O10013 Pre-existing essential hypertension complicating pregnancy, third trimester: Secondary | ICD-10-CM | POA: Diagnosis not present

## 2023-05-12 DIAGNOSIS — Z3A32 32 weeks gestation of pregnancy: Secondary | ICD-10-CM | POA: Insufficient documentation

## 2023-05-12 NOTE — Progress Notes (Addendum)
    NURSE VISIT NOTE  Subjective:    Patient ID: Kristi Bauer, female    DOB: Dec 07, 2000, 22 y.o.   MRN: [id number]  HPI  Patient is a 22 y.o. G36P0020 female who presents for fetal monitoring per order from [name], [place].   Objective:    BP 112/65   Pulse 78   Ht 5' 1.5" (1.562 m)   Wt 182 lb 9.6 oz (82.8 kg)   LMP 09/29/2022 (Exact Date)   BMI 33.94 kg/m  Estimated Date of Delivery: 07/06/23  [redacted]w[redacted]d  Fetus A Non-Stress Test Interpretation for 05/12/23  Indication: Chronic Hypertenstion  Fetal Heart Rate A Mode: External Baseline Rate (A): 130 bpm Variability: Moderate Accelerations: 15 x 15 Decelerations: Variable Multiple birth?: No  Uterine Activity Mode: Toco Contraction Frequency (min): ui  Interpretation (Fetal Testing) Nonstress Test Interpretation: Reactive Overall Impression: Reassuring for gestational age   Assessment:   1. Chronic hypertension affecting pregnancy   2. [redacted] weeks gestation of pregnancy      Plan:   Results reviewed and discussed with patient by  [name], MD.     Rocco Serene, LPN

## 2023-05-12 NOTE — Patient Instructions (Signed)

## 2023-05-13 ENCOUNTER — Encounter: Payer: Self-pay | Admitting: Obstetrics and Gynecology

## 2023-05-18 DIAGNOSIS — Z3A33 33 weeks gestation of pregnancy: Secondary | ICD-10-CM | POA: Insufficient documentation

## 2023-05-18 NOTE — Progress Notes (Addendum)
    NURSE VISIT NOTE  Subjective:    Patient ID: Kristi Bauer, female    DOB: January 02, 2001, 22 y.o.   MRN: [id number]  HPI  Patient is a 22 y.o. G61P0020 female who presents for fetal monitoring per order from [name], [place].   Objective:    LMP 09/29/2022 (Exact Date)  Estimated Date of Delivery: 07/06/23  [redacted]w[redacted]d  Fetus A Non-Stress Test Interpretation for 05/18/23  Indication: Chronic Hypertenstion            Assessment:   1. Chronic hypertension complicating or reason for care during pregnancy, third trimester   2. [redacted] weeks gestation of pregnancy      Plan:   Results reviewed and discussed with patient by  {[email or web address]:[id number]}.     Rocco Serene, LPN

## 2023-05-18 NOTE — Patient Instructions (Signed)

## 2023-05-19 ENCOUNTER — Ambulatory Visit: Payer: BC Managed Care – PPO | Admitting: Obstetrics and Gynecology

## 2023-05-19 ENCOUNTER — Encounter: Payer: Self-pay | Admitting: Obstetrics and Gynecology

## 2023-05-19 ENCOUNTER — Ambulatory Visit: Payer: BC Managed Care – PPO

## 2023-05-19 VITALS — BP 122/76 | HR 84 | Ht 61.5 in | Wt 190.5 lb

## 2023-05-19 VITALS — BP 122/76 | HR 84 | Wt 190.5 lb

## 2023-05-19 DIAGNOSIS — Z3480 Encounter for supervision of other normal pregnancy, unspecified trimester: Secondary | ICD-10-CM

## 2023-05-19 DIAGNOSIS — Z3483 Encounter for supervision of other normal pregnancy, third trimester: Secondary | ICD-10-CM

## 2023-05-19 DIAGNOSIS — Z3A33 33 weeks gestation of pregnancy: Secondary | ICD-10-CM

## 2023-05-19 DIAGNOSIS — O10013 Pre-existing essential hypertension complicating pregnancy, third trimester: Secondary | ICD-10-CM | POA: Diagnosis not present

## 2023-05-19 DIAGNOSIS — O10919 Unspecified pre-existing hypertension complicating pregnancy, unspecified trimester: Secondary | ICD-10-CM

## 2023-05-19 DIAGNOSIS — Z23 Encounter for immunization: Secondary | ICD-10-CM | POA: Diagnosis not present

## 2023-05-19 DIAGNOSIS — Z2911 Encounter for prophylactic immunotherapy for respiratory syncytial virus (RSV): Secondary | ICD-10-CM

## 2023-05-19 DIAGNOSIS — O10913 Unspecified pre-existing hypertension complicating pregnancy, third trimester: Secondary | ICD-10-CM

## 2023-05-19 LAB — POCT URINALYSIS DIPSTICK OB
Bilirubin, UA: NEGATIVE
Blood, UA: NEGATIVE
Glucose, UA: NEGATIVE
Ketones, UA: NEGATIVE
Leukocytes, UA: NEGATIVE
Nitrite, UA: NEGATIVE
POC,PROTEIN,UA: NEGATIVE
Spec Grav, UA: 1.02 (ref 1.010–1.025)
Urobilinogen, UA: 0.2 U/dL
pH, UA: 7.5 (ref 5.0–8.0)

## 2023-05-19 NOTE — Progress Notes (Signed)
ROB: She reports she is doing well.  Has occasional Kristi Bauer.  Reports daily fetal movement.  Taking medications as directed.  Blood pressure good today.  NST reactive today.  RSV given.

## 2023-05-19 NOTE — Progress Notes (Signed)
ROB. Patient states daily fetal movement along with some pain. Remains compliant with BP medication and ASA. NST today. RSV vaccine administered today. She states no questions or concerns at this time.

## 2023-05-20 ENCOUNTER — Encounter: Payer: Self-pay | Admitting: Obstetrics and Gynecology

## 2023-05-26 ENCOUNTER — Ambulatory Visit: Payer: BC Managed Care – PPO | Admitting: Obstetrics

## 2023-05-26 ENCOUNTER — Encounter: Payer: Self-pay | Admitting: Obstetrics

## 2023-05-26 ENCOUNTER — Ambulatory Visit: Payer: BC Managed Care – PPO

## 2023-05-26 VITALS — BP 115/72 | HR 110 | Ht 61.5 in | Wt 187.9 lb

## 2023-05-26 VITALS — BP 115/72 | HR 110 | Wt 187.9 lb

## 2023-05-26 DIAGNOSIS — Z3A34 34 weeks gestation of pregnancy: Secondary | ICD-10-CM | POA: Diagnosis not present

## 2023-05-26 DIAGNOSIS — O10913 Unspecified pre-existing hypertension complicating pregnancy, third trimester: Secondary | ICD-10-CM

## 2023-05-26 DIAGNOSIS — Z3480 Encounter for supervision of other normal pregnancy, unspecified trimester: Secondary | ICD-10-CM

## 2023-05-26 DIAGNOSIS — O10013 Pre-existing essential hypertension complicating pregnancy, third trimester: Secondary | ICD-10-CM | POA: Diagnosis not present

## 2023-05-26 DIAGNOSIS — O10919 Unspecified pre-existing hypertension complicating pregnancy, unspecified trimester: Secondary | ICD-10-CM

## 2023-05-26 NOTE — Progress Notes (Signed)
    NURSE VISIT NOTE  Subjective:    Patient ID: Kristi Bauer, female    DOB: [birth year], 22 y.o.   MRN: [id number]  HPI  Patient is a 22 y.o. G33P0020 female who presents for fetal monitoring per order from Brennan Bailey, MD.   Objective:    BP 115/72   Pulse (!) 110   Ht 5' 1.5" (1.562 m)   Wt 187 lb 14.4 oz (85.2 kg)   LMP 09/29/2022 (Exact Date)   BMI 34.93 kg/m  Estimated Date of Delivery: 07/06/23  [redacted]w[redacted]d  Fetus A Non-Stress Test Interpretation for 05/26/23  Indication: Chronic Hypertenstion  Fetal Heart Rate A Mode: External Baseline Rate (A): 130 bpm Variability: Moderate Accelerations: 15 x 15 Decelerations: None Multiple birth?: No  Uterine Activity Mode: Toco Contraction Frequency (min): None  Interpretation (Fetal Testing) Nonstress Test Interpretation: Reactive Overall Impression: Reassuring for gestational age   Assessment:   1. Chronic hypertension complicating or reason for care during pregnancy, third trimester   2. [redacted] weeks gestation of pregnancy      Plan:   Results reviewed and discussed with patient by  Guadlupe Spanish, CNM.     Rocco Serene, LPN

## 2023-05-26 NOTE — Progress Notes (Signed)
    Return Prenatal Note   Assessment/Plan   Plan  22 y.o. G3P0020 at [redacted]w[redacted]d presents for follow-up OB visit. Reviewed prenatal record including previous visit note.  Chronic hypertension complicating or reason for care during pregnancy, third trimester -Taking Procardia XL 30 mg daily -Normotensive today -Reactive NST today -BPP/growth scan next week -Instructed to go to the hospital with severe HA, visual changes, or RUQ pain  Supervision of other normal pregnancy, antepartum -Discussed IOL around 39 weeks for [facility] on medication. Discussed natural labor encouragement after 36 weeks. -Reviewed s/s of labor and when to go to the hospital -Pediatrician list sent via MyChart -Mood is stable on Zoloft -Discussed GC/chlamydia and GBS swabs at next visit   Orders Placed This Encounter  Procedures   US OB Comp + 14 Wk    Standing Status:   Future    Standing Expiration Date:   06/26/2023    Scheduling Instructions:     Growth/bpp 06/02/23    Order Specific Question:   Reason for Exam (SYMPTOM  OR DIAGNOSIS REQUIRED)    Answer:   Growth    Order Specific Question:   Preferred Imaging Location?    Answer:   [facility]    Order Specific Question:   Release to patient    Answer:   Immediate   US Fetal BPP W/O Non Stress    Standing Status:   Future    Standing Expiration Date:   05/25/2024    Scheduling Instructions:     Growth/bpp 10/30    Order Specific Question:   Reason for Exam (SYMPTOM  OR DIAGNOSIS REQUIRED)    Answer:   CHTN    Order Specific Question:   Preferred Imaging Location?    Answer:   [facility]    Order Specific Question:   Release to patient    Answer:   Immediate   Return in about 2 weeks (around 06/09/2023).   No future appointments.  For next visit:   BPP, then ROB with NST     Subjective   Kristi Bauer is feeling well. She is having fewer headaches, and they are usually manageable with rest. She has completed her  childbirth class. She plans to breastfeed. She has everything ready for the baby at home and needs to choose a pediatrician.  Movement: Present Contractions: Not present  Objective   Flow sheet Vitals: Pulse Rate: (!) 110 BP: 115/72 Fundal Height: 34 cm Fetal Heart Rate (bpm): see NST Total weight gain: 45 lb 14.4 oz (20.8 kg)  General Appearance  No acute distress, well appearing, and well nourished Pulmonary   Normal work of breathing Neurologic   Alert and oriented to person, place, and time Psychiatric   Mood and affect within normal limits  Guadlupe Spanish, CNM 05/26/23 10:17 AM

## 2023-05-26 NOTE — Assessment & Plan Note (Addendum)
-  Taking Procardia XL 30 mg daily -Normotensive today -Reactive NST today -BPP/growth scan next week -Instructed to go to the hospital with severe HA, visual changes, or RUQ pain

## 2023-05-26 NOTE — Patient Instructions (Signed)

## 2023-05-26 NOTE — Assessment & Plan Note (Addendum)
-  Discussed IOL around 39 weeks for [facility] on medication. Discussed natural labor encouragement after 36 weeks. -Reviewed s/s of labor and when to go to the hospital -Pediatrician list sent via MyChart -Mood is stable on Zoloft -Discussed GC/chlamydia and GBS swabs at next visit

## 2023-05-27 ENCOUNTER — Encounter: Payer: Self-pay | Admitting: Obstetrics

## 2023-06-04 ENCOUNTER — Ambulatory Visit (INDEPENDENT_AMBULATORY_CARE_PROVIDER_SITE_OTHER): Payer: BC Managed Care – PPO

## 2023-06-04 VITALS — BP 116/73 | HR 97 | Ht 61.5 in | Wt 189.1 lb

## 2023-06-04 DIAGNOSIS — O10013 Pre-existing essential hypertension complicating pregnancy, third trimester: Secondary | ICD-10-CM

## 2023-06-04 DIAGNOSIS — Z3A35 35 weeks gestation of pregnancy: Secondary | ICD-10-CM | POA: Diagnosis not present

## 2023-06-04 DIAGNOSIS — O10913 Unspecified pre-existing hypertension complicating pregnancy, third trimester: Secondary | ICD-10-CM

## 2023-06-04 NOTE — Progress Notes (Signed)
    NURSE VISIT NOTE  Subjective:    Patient ID: Kristi Bauer, female    DOB: 2000-08-12, 22 y.o.   MRN: [id number]  HPI  Patient is a 22 y.o. G49P0020 female who presents for fetal monitoring per order from Guadlupe Spanish, CNM.   Objective:    BP 116/73   Pulse 97   Ht 5' 1.5" (1.562 m)   Wt 189 lb 1.6 oz (85.8 kg)   LMP 09/29/2022 (Exact Date)   BMI 35.15 kg/m  Estimated Date of Delivery: 07/06/23  [redacted]w[redacted]d  Fetus A Non-Stress Test Interpretation for 06/04/23  Indication: Chronic Hypertenstion  Fetal Heart Rate A Mode: External Baseline Rate (A): 135 bpm Variability: Moderate Accelerations: 15 x 15 Decelerations: None Multiple birth?: No  Uterine Activity Mode: Toco Contraction Frequency (min): UI  Interpretation (Fetal Testing) Nonstress Test Interpretation: Reactive Overall Impression: Reassuring for gestational age   Assessment:   1. Chronic hypertension complicating or reason for care during pregnancy, third trimester   2. [redacted] weeks gestation of pregnancy      Plan:   Results reviewed by  Paula Compton, CNM  and discussed with patient.    Kristi Serene, LPN

## 2023-06-04 NOTE — Patient Instructions (Signed)

## 2023-06-07 ENCOUNTER — Encounter: Payer: Self-pay | Admitting: Obstetrics

## 2023-06-09 ENCOUNTER — Other Ambulatory Visit (HOSPITAL_COMMUNITY)
Admission: RE | Admit: 2023-06-09 | Discharge: 2023-06-09 | Disposition: A | Discharge status: Home / Self Care | Payer: BC Managed Care – PPO | Source: Ambulatory Visit | Attending: Obstetrics | Admitting: Obstetrics

## 2023-06-09 ENCOUNTER — Other Ambulatory Visit: Payer: BC Managed Care – PPO

## 2023-06-09 ENCOUNTER — Ambulatory Visit (INDEPENDENT_AMBULATORY_CARE_PROVIDER_SITE_OTHER): Payer: BC Managed Care – PPO | Admitting: Obstetrics

## 2023-06-09 VITALS — BP 120/80 | HR 96 | Wt 190.0 lb

## 2023-06-09 DIAGNOSIS — Z3480 Encounter for supervision of other normal pregnancy, unspecified trimester: Secondary | ICD-10-CM | POA: Insufficient documentation

## 2023-06-09 DIAGNOSIS — Z3685 Encounter for antenatal screening for Streptococcus B: Secondary | ICD-10-CM | POA: Diagnosis not present

## 2023-06-09 DIAGNOSIS — Z113 Encounter for screening for infections with a predominantly sexual mode of transmission: Secondary | ICD-10-CM | POA: Insufficient documentation

## 2023-06-09 DIAGNOSIS — O10913 Unspecified pre-existing hypertension complicating pregnancy, third trimester: Secondary | ICD-10-CM

## 2023-06-09 DIAGNOSIS — Z3A36 36 weeks gestation of pregnancy: Secondary | ICD-10-CM

## 2023-06-09 DIAGNOSIS — O10013 Pre-existing essential hypertension complicating pregnancy, third trimester: Secondary | ICD-10-CM

## 2023-06-09 NOTE — Progress Notes (Signed)
    Return Prenatal Note   Subjective  22 y.o. G3P0020 at [redacted]w[redacted]d presents for this follow-up prenatal visit with her mother. Pregnancy c/b cHTN.  Patient is well. Taking daily Procardia and denies HA, RUQ/epigastric pain, vision changes. Questions about induction and wanting to discuss potential dates. Will also be moving soon, lease ends on 11/27, trying to plan logistics of move with delivery, and FOB's [birth year] is [birth year], unsure of timing.   Patient reports: Movement: Present Contractions: Irregular Denies vaginal bleeding or leaking fluid. Objective  Flow sheet Vitals: Pulse Rate: 96 BP: 120/80 Fundal Height: 36 cm Fetal Heart Rate (bpm): 143 Presentation: Vertex Dilation: Closed Effacement (%): 60 Station: -1 Total weight gain: 48 lb (21.8 kg)  General Appearance  No acute distress, well appearing, and well nourished Pulmonary   Normal work of breathing Neurologic   Alert and oriented to person, place, and time Psychiatric   Mood and affect within normal limits  Assessment/Plan   Plan  22 y.o. [id number] at [redacted]w[redacted]d presents for follow-up OB visit. Reviewed prenatal record including previous visit note.  Chronic hypertension complicating or reason for care during pregnancy, third trimester -Normotensive today, 120/80 -Continue LDASA and Procardia XL 30mg  daily -Continue weekly NSTs, 3rd tri growth [place] on 11/7 in clinic (ins denied at [facility]) -Discussed potential induction dates, recommend 38.0-39.6wks, pending BPs. Pt to discuss with her partner, would like to schedule date at next visit.   Supervision of other normal pregnancy, antepartum -GBS, GCCT done today -PTL precautions given -Still needs to select PP contraception  Return in about 1 week (around 06/16/2023).   Future Appointments  Date Time Provider Department Center  06/10/2023  3:30 PM [facility]  06/16/2023  8:45 AM [facility]-NST ROOM [facility] None  06/16/2023  8:55 AM Dominic, Courtney Heys, CNM [facility]  None  06/23/2023  8:45 AM AOB-NST ROOM [facility]OB None  06/23/2023  8:55 AM Free, Lindalou Hose, CNM [facility]-AOB None  06/30/2023  8:45 AM AOB-NST ROOM A[facility]-AOB None  06/30/2023  8:55 AM Dominic, Courtney Heys, CNM A[facility]-AOB None   For next visit:  continue with routine prenatal care   Julieanne Manson, DO [facility]

## 2023-06-09 NOTE — Assessment & Plan Note (Addendum)
-  Normotensive today, 120/80 -Continue LDASA and Procardia XL 30mg  daily -Continue weekly NSTs, growth [place] to be resched here (ins denied at [facility]) -Discussed potential induction dates, recommend 38.0-39.6wks, pending BPs. Pt to discuss with her partner, would like to schedule date at next visit.

## 2023-06-09 NOTE — Assessment & Plan Note (Signed)
-  GBS, GCCT done today -PTL precautions given -Still needs to select PP contraception

## 2023-06-10 ENCOUNTER — Other Ambulatory Visit: Payer: BC Managed Care – PPO

## 2023-06-10 ENCOUNTER — Ambulatory Visit: Payer: BC Managed Care – PPO

## 2023-06-10 ENCOUNTER — Ambulatory Visit: Admission: RE | Admit: 2023-06-10 | Payer: BC Managed Care – PPO | Source: Ambulatory Visit

## 2023-06-10 VITALS — BP 114/67 | HR 94 | Ht 61.0 in | Wt 188.5 lb

## 2023-06-10 DIAGNOSIS — R519 Headache, unspecified: Secondary | ICD-10-CM

## 2023-06-10 DIAGNOSIS — Z3A36 36 weeks gestation of pregnancy: Secondary | ICD-10-CM | POA: Insufficient documentation

## 2023-06-10 DIAGNOSIS — O10012 Pre-existing essential hypertension complicating pregnancy, second trimester: Secondary | ICD-10-CM | POA: Diagnosis not present

## 2023-06-10 DIAGNOSIS — Z0130 Encounter for examination of blood pressure without abnormal findings: Secondary | ICD-10-CM

## 2023-06-10 DIAGNOSIS — Z3A24 24 weeks gestation of pregnancy: Secondary | ICD-10-CM | POA: Diagnosis not present

## 2023-06-10 DIAGNOSIS — O10013 Pre-existing essential hypertension complicating pregnancy, third trimester: Secondary | ICD-10-CM

## 2023-06-10 DIAGNOSIS — O10919 Unspecified pre-existing hypertension complicating pregnancy, unspecified trimester: Secondary | ICD-10-CM

## 2023-06-10 LAB — CERVICOVAGINAL ANCILLARY ONLY
Chlamydia: NEGATIVE
Comment: NEGATIVE
Comment: NORMAL
Neisseria Gonorrhea: NEGATIVE

## 2023-06-10 NOTE — Patient Instructions (Signed)
Acetaminophen Capsules or Tablets What is this medication? ACETAMINOPHEN (a set a MEE noe fen) treats mild to moderate pain. It may also be used to reduce fever. This medicine may be used for other purposes; ask your health care provider or pharmacist if you have questions. COMMON BRAND NAME(S): Aceta, Actamin, Anacin Aspirin Free, Aphen, Genapap, Genebs, Mapap, Pain & Fever, Pain and Fever, PAIN RELIEF, PAIN RELIEF Extra Strength, Pain Reliever, Panadol, PHARBETOL, Plus PHARMA, Q-Pap, Q-Pap Extra Strength, Tylenol, Tylenol CrushableTablet, Tylenol Extra Strength, Tylenol Regular Strength, XS No Aspirin, XS Pain Reliever What should I tell my care team before I take this medication? They need to know if you have any of these conditions: Frequently drink alcohol Liver disease An unusual or allergic reaction to acetaminophen, other medications, foods, dyes, or preservatives Pregnant or trying to get pregnant Breastfeeding How should I use this medication? Take this medication by mouth with a glass of water. Take it as directed on the package or prescription label. Take your medication at regular intervals. Do not take your medication more often than directed. Talk to your care team about the use of this medication in children. While it may be prescribed for children as young as 19 years of age for selected conditions, precautions do apply. Overdosage: If you think you have taken too much of this medicine contact a poison control center or emergency room at once. NOTE: This medicine is only for you. Do not share this medicine with others. What if I miss a dose? If you miss a dose, take it as soon as you can. If it is almost time for your next dose, take only that dose. Do not take double or extra doses. What may interact with this medication? Alcohol Imatinib Isoniazid Other medications with acetaminophen This list may not describe all possible interactions. Give your health care provider a list  of all the medicines, herbs, non-prescription drugs, or dietary supplements you use. Also tell them if you smoke, drink alcohol, or use illegal drugs. Some items may interact with your medicine. What should I watch for while using this medication? Tell your care team if the pain lasts more than 10 days (5 days for children), if it gets worse, or if there is a new or different kind of pain. Also, check with your care team if a fever lasts for more than 3 days. Do not take other medications that contain acetaminophen with this medication. Many non-prescription medications contain acetaminophen. Always read labels carefully. If you have questions, ask your care team. If you take too much acetaminophen, get medical help right away. Too much acetaminophen can be very dangerous and cause liver damage. Even if you do not have symptoms, it is important to get help right away. What side effects may I notice from receiving this medication? Side effects that you should report to your care team as soon as possible: Allergic reactions--skin rash, itching, hives, swelling of the face, lips, tongue, or throat Liver injury--right upper belly pain, loss of appetite, nausea, light-colored stool, dark yellow or brown urine, yellowing skin or eyes, unusual weakness or fatigue Redness, blistering, peeling, or loosening of the skin, including inside the mouth Side effects that usually do not require medical attention (report to your care team if they continue or are bothersome): Headache Nausea Trouble sleeping Upset stomach This list may not describe all possible side effects. Call your doctor for medical advice about side effects. You may report side effects to FDA at 1-800-FDA-1088. Where should  I keep my medication? Keep out of the reach of children and pets. Store at room temperature between 20 and 25 degrees C (68 and 77 degrees F). Protect from moisture and heat. Get rid of any unused medication after the  expiration date. To get rid of medications that are no longer wanted or have expired: Take the medication to a medication take-back program. Ask your pharmacy or law enforcement to find a location. If you cannot return the medication, check the label or package insert to see if the medication should be thrown out in the garbage or flushed down the toilet. If you are not sure, ask your care team. If it is safe to put it in the trash, empty the medication out of the container. Mix the medication with cat litter, dirt, coffee grounds, or other unwanted substance. Put it in the trash. NOTE: This sheet is a summary. It may not cover all possible information. If you have questions about this medicine, talk to your doctor, pharmacist, or health care provider.  2024 Elsevier/Gold Standard (2022-03-16 00:00:00)

## 2023-06-10 NOTE — Progress Notes (Signed)
    NURSE VISIT NOTE  Subjective:    Patient ID: Kristi Bauer, female    DOB: 06-22-2001, 22 y.o.   MRN: [id number]  HPI  Patient is a 22 y.o. G25P0020 female who presents with complaints of headache for BP check per order from Julieanne Manson, MD. She denies swelling of extremities or visual disturbances. She has not taken any medication for her symptoms.  Patient reports compliance with prescribed BP medications: yes Procardia 30 mgdaily Last dose of BP medication:  06/10/23 at 8:00  BP Readings from Last 3 Encounters:  06/10/23 114/67  06/09/23 120/80  06/04/23 116/73   Pulse Readings from Last 3 Encounters:  06/10/23 94  06/09/23 96  06/04/23 97    Objective:    BP 114/67   Pulse 94   Ht 5\' 1"  (1.549 m)   Wt 188 lb 8 oz (85.5 kg)   LMP 09/29/2022 (Exact Date)   BMI 35.62 kg/m   Assessment:   1. Acute nonintractable headache, unspecified headache type   2. [redacted] weeks gestation of pregnancy      Plan:   Per Dr. Julieanne Manson, MD:  Continue current treatment regimen. Continue to monitor blood pressure at home. Report any reading >140/90 or with any associated symptoms. Return to clinic as scheduled. Take Tylenol as needed.  Can add caffeine (cup of coffe/can of soda).  Patient verbalized understanding of instructions.   Rocco Serene, LPN

## 2023-06-12 LAB — CULTURE, BETA STREP (GROUP B ONLY): Strep Gp B Culture: POSITIVE — AB

## 2023-06-15 DIAGNOSIS — Z3A37 37 weeks gestation of pregnancy: Secondary | ICD-10-CM | POA: Insufficient documentation

## 2023-06-15 NOTE — Progress Notes (Unsigned)
    NURSE VISIT NOTE  Subjective:    Patient ID: Kristi Bauer, female    DOB: 06-26-2001, 22 y.o.   MRN: [id number]  HPI  Patient is a 22 y.o. G62P0020 female who presents for fetal monitoring per order from Julieanne Manson, MD.   Objective:    LMP 09/29/2022 (Exact Date)  Estimated Date of Delivery: 07/06/23  [redacted]w[redacted]d  Fetus A Non-Stress Test Interpretation for 06/15/23  Indication: Chronic Hypertenstion            Assessment:   1. Chronic hypertension complicating or reason for care during pregnancy, third trimester   2. [redacted] weeks gestation of pregnancy      Plan:   Results reviewed and discussed with patient by  Carie Caddy, CNM.     Rocco Serene, LPN

## 2023-06-15 NOTE — Patient Instructions (Signed)

## 2023-06-16 ENCOUNTER — Encounter: Payer: Self-pay | Admitting: Licensed Practical Nurse

## 2023-06-16 ENCOUNTER — Ambulatory Visit (INDEPENDENT_AMBULATORY_CARE_PROVIDER_SITE_OTHER): Payer: BC Managed Care – PPO

## 2023-06-16 ENCOUNTER — Ambulatory Visit (INDEPENDENT_AMBULATORY_CARE_PROVIDER_SITE_OTHER): Payer: BC Managed Care – PPO | Admitting: Licensed Practical Nurse

## 2023-06-16 VITALS — BP 125/81 | HR 103 | Wt 191.3 lb

## 2023-06-16 VITALS — BP 125/81 | HR 103 | Ht 61.0 in | Wt 191.3 lb

## 2023-06-16 DIAGNOSIS — O10919 Unspecified pre-existing hypertension complicating pregnancy, unspecified trimester: Secondary | ICD-10-CM

## 2023-06-16 DIAGNOSIS — O10013 Pre-existing essential hypertension complicating pregnancy, third trimester: Secondary | ICD-10-CM | POA: Diagnosis not present

## 2023-06-16 DIAGNOSIS — Z3A37 37 weeks gestation of pregnancy: Secondary | ICD-10-CM

## 2023-06-16 DIAGNOSIS — O10913 Unspecified pre-existing hypertension complicating pregnancy, third trimester: Secondary | ICD-10-CM

## 2023-06-16 DIAGNOSIS — Z3490 Encounter for supervision of normal pregnancy, unspecified, unspecified trimester: Secondary | ICD-10-CM

## 2023-06-16 DIAGNOSIS — O0990 Supervision of high risk pregnancy, unspecified, unspecified trimester: Secondary | ICD-10-CM

## 2023-06-16 DIAGNOSIS — Z3483 Encounter for supervision of other normal pregnancy, third trimester: Secondary | ICD-10-CM

## 2023-06-16 DIAGNOSIS — Z3480 Encounter for supervision of other normal pregnancy, unspecified trimester: Secondary | ICD-10-CM

## 2023-06-16 LAB — POCT URINALYSIS DIPSTICK
Bilirubin, UA: NEGATIVE
Blood, UA: NEGATIVE
Glucose, UA: NEGATIVE
Ketones, UA: NEGATIVE
Leukocytes, UA: NEGATIVE
Nitrite, UA: NEGATIVE
Protein, UA: NEGATIVE
Spec Grav, UA: 1.01 (ref 1.010–1.025)
Urobilinogen, UA: 0.2 U/dL
pH, UA: 6.5 (ref 5.0–8.0)

## 2023-06-16 MED ORDER — SERTRALINE HCL 50 MG PO TABS: 75.0000 mg | ORAL_TABLET | Freq: Every day | ORAL | 3 refills | Status: DC

## 2023-06-16 NOTE — Progress Notes (Signed)
Routine Prenatal Care Visit  Subjective  Kristi Bauer is a 22 y.o. G3P0020 at [redacted]w[redacted]d being seen today for ongoing prenatal care.  She is currently monitored for the following issues for this high-risk pregnancy and has Depression, major, single episode, mild (HCC); Supervision of other normal pregnancy, antepartum; Chronic hypertension complicating or reason for care during pregnancy, third trimester; Maternal varicella, non-immune; Headache; [redacted] weeks gestation of pregnancy; and [redacted] weeks gestation of pregnancy on their problem list.  ----------------------------------------------------------------------------------- Patient reports no complaints.   -Doing well. Took CBE, planning epidural -Desires IOL at 39wks, scheduled on 11/30, aware induction  method is based on cervical exam, she can expect a ripening balloon and Cytotec to start. -her partner will be her labor support, they have family nearby for pp support. -hx of depression on Zoloft, would like to increase to 75mg  to prevent PPD -RNST  Contractions: Irritability. Vag. Bleeding: None.  Movement: Present. Leaking Fluid denies.  ----------------------------------------------------------------------------------- The following portions of the patient's history were reviewed and updated as appropriate: allergies, current medications, past family history, past medical history, past social history, past surgical history and problem list. Problem list updated.  Objective  Blood pressure 125/81, pulse (!) 103, weight 191 lb 4.8 oz (86.8 kg), last menstrual period 09/29/2022, unknown if currently breastfeeding. Pregravid weight 142 lb (64.4 kg) Total Weight Gain 49 lb 4.8 oz (22.4 kg) Urinalysis: Urine Protein    Urine Glucose    Fetal Status: Fetal Heart Rate (bpm): 140   Movement: Present  Presentation: Vertex  General:  Alert, oriented and cooperative. Patient is in no acute distress.  Skin: Skin is warm and dry. No rash noted.    Cardiovascular: Normal heart rate noted  Respiratory: Normal respiratory effort, no problems with respiration noted  Abdomen: Soft, gravid, appropriate for gestational age. Pain/Pressure: Present     Pelvic:  Cervical exam performed Dilation: Fingertip Effacement (%): 60 Station: -2  Extremities: Normal range of motion.  Edema: Trace  Mental Status: Normal mood and affect. Normal behavior. Normal judgment and thought content.   Assessment   22 y.o. [id number] at [redacted]w[redacted]d by  07/06/2023, Alternate EDD Entry presenting for routine prenatal visit  Plan   G3 Problems (from 11/05/22 to present)     Problem Noted Resolved   Supervision of other normal pregnancy, antepartum 11/26/2022 by Loran Senters, CMA No   Overview Addendum 06/14/2023  8:18 AM by Julieanne Manson, MD     Clinical Staff Provider  Office Location  [facility] Dating  LMP=[redacted]w[redacted]d [place]  Language  English Anatomy US  WNL  Flu Vaccine  05/16/23 Genetic Screen  NIPS: low risk, XY  TDaP vaccine   04/19/23 Hgb A1C or  GTT Early : Third trimester : 108  Covid No boosters   LAB RESULTS   Rhogam  B/Positive/-- (05/14 0859)  Blood Type B/Positive/-- (05/14 0859)   Feeding Plan breast Antibody Negative (05/14 0859)  Contraception undecided Rubella 1.52 (05/14 0859)  Circumcision yes RPR Non Reactive (05/14 0859)   Pediatrician  undecided HBsAg Negative (05/14 0859)   Support Person Tora Perches HIV Non Reactive (05/14 0859)  Prenatal Classes yes Varicella Non immune    GBS  POSITIVE  BTL Consent  Hep C Non Reactive (05/14 0859)   VBAC Consent  Pap No results found for: "DIAGPAP"    Hgb Electro  Normal  RSV Vaccine 05/19/23 CF      SMA  Term labor symptoms and general obstetric precautions including but not limited to vaginal bleeding, contractions, leaking of fluid and fetal movement were reviewed in detail with the patient. Please refer to After Visit Summary for other counseling recommendations.   Return in  about 1 week (around 06/23/2023) for ROB, NST.  IOL 11/30 at 0800, orders placed   Carie Caddy, [place]  [facility]  06/16/23  12:58 PM

## 2023-06-18 ENCOUNTER — Encounter: Payer: Self-pay | Admitting: Licensed Practical Nurse

## 2023-06-23 ENCOUNTER — Ambulatory Visit: Payer: BC Managed Care – PPO

## 2023-06-23 VITALS — BP 112/65 | HR 99 | Ht 61.0 in | Wt 188.0 lb

## 2023-06-23 VITALS — BP 112/65 | HR 99 | Wt 188.0 lb

## 2023-06-23 DIAGNOSIS — Z3A38 38 weeks gestation of pregnancy: Secondary | ICD-10-CM | POA: Diagnosis not present

## 2023-06-23 DIAGNOSIS — O10913 Unspecified pre-existing hypertension complicating pregnancy, third trimester: Secondary | ICD-10-CM

## 2023-06-23 DIAGNOSIS — O10013 Pre-existing essential hypertension complicating pregnancy, third trimester: Secondary | ICD-10-CM

## 2023-06-23 DIAGNOSIS — F320 Major depressive disorder, single episode, mild: Secondary | ICD-10-CM

## 2023-06-23 NOTE — Progress Notes (Signed)
    NURSE VISIT NOTE  Subjective:    Patient ID: Kristi Bauer, female    DOB: 04-Mar-2001, 22 y.o.   MRN: [id number]  HPI  Patient is a 22 y.o. G36P0020 female who presents for fetal monitoring per order from Carie Caddy, [place].   Objective:    BP 112/65   Pulse 99   Ht 5\' 1"  (1.549 m)   Wt 188 lb (85.3 kg)   LMP 09/29/2022 (Exact Date)   BMI 35.52 kg/m  Estimated Date of Delivery: 07/06/23  [redacted]w[redacted]d  Fetus A Non-Stress Test Interpretation for 06/23/23  Indication: Chronic Hypertenstion  Fetal Heart Rate A Mode: External Baseline Rate (A): 130 bpm Variability: Moderate Accelerations: 15 x 15 Decelerations: None Multiple birth?: No  Uterine Activity Mode: Toco Contraction Frequency (min): None  Interpretation (Fetal Testing) Nonstress Test Interpretation: Reactive Overall Impression: Reassuring for gestational age   Assessment:   1. Chronic hypertension complicating or reason for care during pregnancy, third trimester   2. [redacted] weeks gestation of pregnancy      Plan:   Results reviewed and discussed with patient by  [name], CNM.     Rocco Serene, LPN

## 2023-06-23 NOTE — Patient Instructions (Signed)

## 2023-06-23 NOTE — Assessment & Plan Note (Addendum)
-   RNST today in clinic. - IOL scheduled for 11/30. Reviewed expectations for possible induction methods and length of induction.

## 2023-06-23 NOTE — Progress Notes (Signed)
    Return Prenatal Note   Assessment/Plan   Plan  22 y.o. G3P0020 at [redacted]w[redacted]d presents for follow-up OB visit. Reviewed prenatal record including previous visit note.  Chronic hypertension complicating or reason for care during pregnancy, third trimester - RNST today in clinic. - IOL scheduled for 11/30. Reviewed expectations for possible induction methods and length of induction.   No orders of the defined types were placed in this encounter.  Return in about 1 week (around 06/30/2023) for Already scheduled appointment.   Future Appointments  Date Time Provider Department Center  06/30/2023  8:45 AM [facility]-NST ROOM [facility] None  06/30/2023  8:55 AM Dominic, Courtney Heys, CNM [facility]-AOB None    For next visit:  continue with routine prenatal care     Subjective   22 y.o. G3P0020 at [redacted]w[redacted]d presents for this follow-up prenatal visit.  Patient has no concerns. Ready for baby! Patient reports: Movement: Present Contractions: Irritability  Objective   Flow sheet Vitals: Pulse Rate: 99 BP: 112/65 Fundal Height: 38 cm Fetal Heart Rate (bpm): RNST Presentation: Vertex Total weight gain: 46 lb (20.9 kg)  General Appearance  No acute distress, well appearing, and well nourished Pulmonary   Normal work of breathing Neurologic   Alert and oriented to person, place, and time Psychiatric   Mood and affect within normal limits  Lindalou Hose Cordelia Bessinger, CNM  11/20/249:10 AM

## 2023-06-29 DIAGNOSIS — Z3A39 39 weeks gestation of pregnancy: Secondary | ICD-10-CM | POA: Insufficient documentation

## 2023-06-29 NOTE — Progress Notes (Unsigned)
    NURSE VISIT NOTE  Subjective:    Patient ID: Kristi Bauer, female    DOB: 2001-01-10, 22 y.o.   MRN: [id number]  HPI  Patient is a 22 y.o. G9P0020 female who presents for fetal monitoring per order from [name], [place].   Objective:    LMP 09/29/2022 (Exact Date)  Estimated Date of Delivery: 07/06/23  [redacted]w[redacted]d  Fetus A Non-Stress Test Interpretation for 06/29/23  Indication: Chronic Hypertenstion            Assessment:   1. Chronic hypertension complicating or reason for care during pregnancy, third trimester   2. [redacted] weeks gestation of pregnancy      Plan:   Results reviewed and discussed with patient by  Carie Caddy, CNM.     Rocco Serene, LPN

## 2023-06-29 NOTE — Patient Instructions (Signed)

## 2023-06-30 ENCOUNTER — Other Ambulatory Visit: Payer: Self-pay

## 2023-06-30 ENCOUNTER — Ambulatory Visit (INDEPENDENT_AMBULATORY_CARE_PROVIDER_SITE_OTHER): Payer: BC Managed Care – PPO | Admitting: Licensed Practical Nurse

## 2023-06-30 ENCOUNTER — Encounter: Payer: Self-pay | Admitting: Obstetrics and Gynecology

## 2023-06-30 ENCOUNTER — Inpatient Hospital Stay
Admission: EM | Admit: 2023-06-30 | Discharge: 2023-07-02 | DRG: 806 | Disposition: A | Discharge status: Home / Self Care | Payer: BC Managed Care – PPO

## 2023-06-30 ENCOUNTER — Encounter: Payer: Self-pay | Admitting: Licensed Practical Nurse

## 2023-06-30 ENCOUNTER — Ambulatory Visit (INDEPENDENT_AMBULATORY_CARE_PROVIDER_SITE_OTHER): Payer: BC Managed Care – PPO

## 2023-06-30 VITALS — BP 109/62 | HR 84 | Wt 192.0 lb

## 2023-06-30 VITALS — BP 109/62 | HR 84 | Ht 61.0 in | Wt 192.0 lb

## 2023-06-30 DIAGNOSIS — O0990 Supervision of high risk pregnancy, unspecified, unspecified trimester: Secondary | ICD-10-CM

## 2023-06-30 DIAGNOSIS — O4290 Premature rupture of membranes, unspecified as to length of time between rupture and onset of labor, unspecified weeks of gestation: Principal | ICD-10-CM | POA: Diagnosis present

## 2023-06-30 DIAGNOSIS — Z3A39 39 weeks gestation of pregnancy: Secondary | ICD-10-CM

## 2023-06-30 DIAGNOSIS — Z2839 Other underimmunization status: Secondary | ICD-10-CM

## 2023-06-30 DIAGNOSIS — O10913 Unspecified pre-existing hypertension complicating pregnancy, third trimester: Secondary | ICD-10-CM | POA: Diagnosis present

## 2023-06-30 DIAGNOSIS — Z3490 Encounter for supervision of normal pregnancy, unspecified, unspecified trimester: Secondary | ICD-10-CM

## 2023-06-30 DIAGNOSIS — O10013 Pre-existing essential hypertension complicating pregnancy, third trimester: Secondary | ICD-10-CM | POA: Diagnosis not present

## 2023-06-30 DIAGNOSIS — Z8249 Family history of ischemic heart disease and other diseases of the circulatory system: Secondary | ICD-10-CM

## 2023-06-30 DIAGNOSIS — Z3480 Encounter for supervision of other normal pregnancy, unspecified trimester: Secondary | ICD-10-CM

## 2023-06-30 DIAGNOSIS — O1092 Unspecified pre-existing hypertension complicating childbirth: Secondary | ICD-10-CM | POA: Diagnosis present

## 2023-06-30 DIAGNOSIS — O99344 Other mental disorders complicating childbirth: Secondary | ICD-10-CM | POA: Diagnosis present

## 2023-06-30 DIAGNOSIS — Z23 Encounter for immunization: Secondary | ICD-10-CM

## 2023-06-30 DIAGNOSIS — F320 Major depressive disorder, single episode, mild: Secondary | ICD-10-CM | POA: Diagnosis present

## 2023-06-30 DIAGNOSIS — Z79899 Other long term (current) drug therapy: Secondary | ICD-10-CM

## 2023-06-30 DIAGNOSIS — O4292 Full-term premature rupture of membranes, unspecified as to length of time between rupture and onset of labor: Principal | ICD-10-CM | POA: Diagnosis present

## 2023-06-30 NOTE — Progress Notes (Signed)
Routine Prenatal Care Visit  Subjective  Kristi Bauer is a 22 y.o. G3P0020 at [redacted]w[redacted]d being seen today for ongoing prenatal care.  She is currently monitored for the following issues for this high-risk pregnancy and has Depression, major, single episode, mild (HCC); Supervision of other normal pregnancy, antepartum; Chronic hypertension complicating or reason for care during pregnancy, third trimester; Maternal varicella, non-immune; Headache; [redacted] weeks gestation of pregnancy; and [redacted] weeks gestation of pregnancy on their problem list.  ----------------------------------------------------------------------------------- Patient reports no complaints.   -Mood has been good, has been on 75mg  zoloft -recently moved into a new apartment -feels ready for labor  -RNST  Contractions: Irregular. Vag. Bleeding: None.  Movement: Present. Leaking Fluid denies.  ----------------------------------------------------------------------------------- The following portions of the patient's history were reviewed and updated as appropriate: allergies, current medications, past family history, past medical history, past social history, past surgical history and problem list. Problem list updated.  Objective  Blood pressure 109/62, pulse 84, weight 192 lb (87.1 kg), last menstrual period 09/29/2022, unknown if currently breastfeeding. Pregravid weight 142 lb (64.4 kg) Total Weight Gain 50 lb (22.7 kg) Urinalysis: Urine Protein    Urine Glucose    Fetal Status: Fetal Heart Rate (bpm): 130 Fundal Height: 39 cm Movement: Present  Presentation: Vertex  General:  Alert, oriented and cooperative. Patient is in no acute distress.  Skin: Skin is warm and dry. No rash noted.   Cardiovascular: Normal heart rate noted  Respiratory: Normal respiratory effort, no problems with respiration noted  Abdomen: Soft, gravid, appropriate for gestational age. Pain/Pressure: Present     Pelvic:  Cervical exam performed Dilation:  2 Effacement (%): 50 Station: -3  Extremities: Normal range of motion.  Edema: None  Mental Status: Normal mood and affect. Normal behavior. Normal judgment and thought content.   Assessment   22 y.o. [id number] at [redacted]w[redacted]d by  07/06/2023, Alternate EDD Entry presenting for routine prenatal visit  Plan   G3 Problems (from 11/05/22 to present)     Problem Noted Resolved   Supervision of other normal pregnancy, antepartum 11/26/2022 by Loran Senters, CMA No   Overview Addendum 06/14/2023  8:18 AM by Julieanne Manson, MD     Clinical Staff Provider  Office Location  [facility] Dating  LMP=[redacted]w[redacted]d [place]  Language  English Anatomy US  WNL  Flu Vaccine  05/16/23 Genetic Screen  NIPS: low risk, XY  TDaP vaccine   04/19/23 Hgb A1C or  GTT Early : Third trimester : 108  Covid No boosters   LAB RESULTS   Rhogam  B/Positive/-- (05/14 0859)  Blood Type B/Positive/-- (05/14 0859)   Feeding Plan breast Antibody Negative (05/14 0859)  Contraception undecided Rubella 1.52 (05/14 0859)  Circumcision yes RPR Non Reactive (05/14 0859)   Pediatrician  undecided HBsAg Negative (05/14 0859)   Support Person Tora Perches HIV Non Reactive (05/14 0859)  Prenatal Classes yes Varicella Non immune    GBS  POSITIVE  BTL Consent  Hep C Non Reactive (05/14 0859)   VBAC Consent  Pap No results found for: "DIAGPAP"    Hgb Electro  Normal  RSV Vaccine 05/19/23 CF      SMA                    Term labor symptoms and general obstetric precautions including but not limited to vaginal bleeding, contractions, leaking of fluid and fetal movement were reviewed in detail with the patient. Please refer to After Visit Summary for other counseling  recommendations.   Return for IOL 11/30.  Carie Caddy, CNM  [facility]  06/30/23  9:42 AM

## 2023-07-01 ENCOUNTER — Inpatient Hospital Stay: Payer: BC Managed Care – PPO | Admitting: Anesthesiology

## 2023-07-01 ENCOUNTER — Encounter: Payer: Self-pay | Admitting: Obstetrics

## 2023-07-01 DIAGNOSIS — Z3A39 39 weeks gestation of pregnancy: Secondary | ICD-10-CM

## 2023-07-01 DIAGNOSIS — F320 Major depressive disorder, single episode, mild: Secondary | ICD-10-CM | POA: Diagnosis not present

## 2023-07-01 DIAGNOSIS — F32A Depression, unspecified: Secondary | ICD-10-CM

## 2023-07-01 DIAGNOSIS — O99344 Other mental disorders complicating childbirth: Secondary | ICD-10-CM

## 2023-07-01 DIAGNOSIS — O4292 Full-term premature rupture of membranes, unspecified as to length of time between rupture and onset of labor: Secondary | ICD-10-CM | POA: Diagnosis not present

## 2023-07-01 DIAGNOSIS — O4290 Premature rupture of membranes, unspecified as to length of time between rupture and onset of labor, unspecified weeks of gestation: Secondary | ICD-10-CM

## 2023-07-01 DIAGNOSIS — O99820 Streptococcus B carrier state complicating pregnancy: Secondary | ICD-10-CM

## 2023-07-01 DIAGNOSIS — O4202 Full-term premature rupture of membranes, onset of labor within 24 hours of rupture: Secondary | ICD-10-CM

## 2023-07-01 DIAGNOSIS — Z23 Encounter for immunization: Secondary | ICD-10-CM | POA: Diagnosis not present

## 2023-07-01 DIAGNOSIS — Z79899 Other long term (current) drug therapy: Secondary | ICD-10-CM | POA: Diagnosis not present

## 2023-07-01 DIAGNOSIS — O1092 Unspecified pre-existing hypertension complicating childbirth: Secondary | ICD-10-CM | POA: Diagnosis not present

## 2023-07-01 DIAGNOSIS — O1002 Pre-existing essential hypertension complicating childbirth: Secondary | ICD-10-CM | POA: Diagnosis not present

## 2023-07-01 DIAGNOSIS — Z8249 Family history of ischemic heart disease and other diseases of the circulatory system: Secondary | ICD-10-CM | POA: Diagnosis not present

## 2023-07-01 HISTORY — DX: Premature rupture of membranes, unspecified as to length of time between rupture and onset of labor, unspecified weeks of gestation: O42.90

## 2023-07-01 LAB — CBC
HCT: 39.3 % (ref 36.0–46.0)
Hemoglobin: 13.1 g/dL (ref 12.0–15.0)
MCH: 29.2 pg (ref 26.0–34.0)
MCHC: 33.3 g/dL (ref 30.0–36.0)
MCV: 87.7 fL (ref 80.0–100.0)
Platelets: 142 10*3/uL — ABNORMAL LOW (ref 150–400)
RBC: 4.48 MIL/uL (ref 3.87–5.11)
RDW: 13.2 % (ref 11.5–15.5)
WBC: 11.4 10*3/uL — ABNORMAL HIGH (ref 4.0–10.5)
nRBC: 0 % (ref 0.0–0.2)

## 2023-07-01 LAB — RPR: RPR Ser Ql: NONREACTIVE

## 2023-07-01 LAB — TYPE AND SCREEN
ABO/RH(D): B POS
Antibody Screen: NEGATIVE

## 2023-07-01 MED ORDER — LIDOCAINE HCL (PF) 1 % IJ SOLN
INTRAMUSCULAR | Status: AC
  Filled 2023-07-01: qty 30

## 2023-07-01 MED ORDER — MISOPROSTOL 50MCG HALF TABLET: 50.0000 ug | ORAL_TABLET | Freq: Once | ORAL | Status: DC

## 2023-07-01 MED ORDER — ZOLPIDEM TARTRATE 5 MG PO TABS
5.0000 mg | ORAL_TABLET | Freq: Every evening | ORAL | Status: DC | PRN
Start: 2023-07-01 — End: 2023-07-03

## 2023-07-01 MED ORDER — ONDANSETRON HCL 4 MG/2ML IJ SOLN
4.0000 mg | INTRAMUSCULAR | Status: DC | PRN
Start: 2023-07-01 — End: 2023-07-03

## 2023-07-01 MED ORDER — MISOPROSTOL 200 MCG PO TABS
ORAL_TABLET | ORAL | Status: AC
  Filled 2023-07-01: qty 4

## 2023-07-01 MED ORDER — PENICILLIN G POTASSIUM 5000000 UNITS IJ SOLR
5.0000 10*6.[IU] | Freq: Once | INTRAMUSCULAR | Status: AC
  Administered 2023-07-01: 5 10*6.[IU] via INTRAVENOUS
  Filled 2023-07-01: qty 5

## 2023-07-01 MED ORDER — ACETAMINOPHEN 500 MG PO TABS
1000.0000 mg | ORAL_TABLET | Freq: Four times a day (QID) | ORAL | Status: DC | PRN
Start: 2023-07-01 — End: 2023-07-03
  Administered 2023-07-01 – 2023-07-02 (×2): 1000 mg via ORAL
  Filled 2023-07-01 (×3): qty 2

## 2023-07-01 MED ORDER — LACTATED RINGERS IV SOLN
500.0000 mL | Freq: Once | INTRAVENOUS | Status: DC
Start: 2023-07-01 — End: 2023-07-01

## 2023-07-01 MED ORDER — DIPHENHYDRAMINE HCL 50 MG/ML IJ SOLN: 12.5000 mg | INTRAMUSCULAR | Status: DC | PRN

## 2023-07-01 MED ORDER — WITCH HAZEL-GLYCERIN EX PADS
1.0000 | MEDICATED_PAD | CUTANEOUS | Status: DC | PRN
  Administered 2023-07-01: 1 via TOPICAL
  Filled 2023-07-01 (×2): qty 100

## 2023-07-01 MED ORDER — PHENYLEPHRINE 80 MCG/ML (10ML) SYRINGE FOR IV PUSH (FOR BLOOD PRESSURE SUPPORT): 80.0000 ug | PREFILLED_SYRINGE | INTRAVENOUS | Status: DC | PRN

## 2023-07-01 MED ORDER — MAGNESIUM HYDROXIDE 400 MG/5ML PO SUSP: 30.0000 mL | ORAL | Status: DC | PRN

## 2023-07-01 MED ORDER — LIDOCAINE HCL (PF) 1 % IJ SOLN: 30.0000 mL | INTRAMUSCULAR | Status: DC | PRN

## 2023-07-01 MED ORDER — LIDOCAINE-EPINEPHRINE (PF) 1.5 %-1:200000 IJ SOLN
INTRAMUSCULAR | Status: DC | PRN
  Administered 2023-07-01: 3 mL via PERINEURAL

## 2023-07-01 MED ORDER — BUPIVACAINE HCL (PF) 0.25 % IJ SOLN
INTRAMUSCULAR | Status: DC | PRN
  Administered 2023-07-01 (×2): 5 mL via EPIDURAL

## 2023-07-01 MED ORDER — ONDANSETRON HCL 4 MG/2ML IJ SOLN
4.0000 mg | Freq: Four times a day (QID) | INTRAMUSCULAR | Status: DC | PRN
Start: 2023-07-01 — End: 2023-07-01

## 2023-07-01 MED ORDER — ONDANSETRON HCL 4 MG PO TABS: 4.0000 mg | ORAL_TABLET | ORAL | Status: DC | PRN

## 2023-07-01 MED ORDER — PRENATAL MULTIVITAMIN CH
1.0000 | ORAL_TABLET | Freq: Every day | ORAL | Status: DC
  Administered 2023-07-02: 1 via ORAL
  Filled 2023-07-01: qty 1

## 2023-07-01 MED ORDER — OXYTOCIN-SODIUM CHLORIDE 30-0.9 UT/500ML-% IV SOLN: 1.0000 m[IU]/min | INTRAVENOUS | Status: DC

## 2023-07-01 MED ORDER — EPHEDRINE 5 MG/ML INJ
10.0000 mg | INTRAVENOUS | Status: DC | PRN
Start: 2023-07-01 — End: 2023-07-01
  Filled 2023-07-01: qty 5

## 2023-07-01 MED ORDER — LIDOCAINE HCL (PF) 1 % IJ SOLN
INTRAMUSCULAR | Status: DC | PRN
  Administered 2023-07-01: 3 mL

## 2023-07-01 MED ORDER — OXYTOCIN-SODIUM CHLORIDE 30-0.9 UT/500ML-% IV SOLN
2.5000 [IU]/h | INTRAVENOUS | Status: DC
  Filled 2023-07-01: qty 500

## 2023-07-01 MED ORDER — LACTATED RINGERS IV SOLN: INTRAVENOUS | Status: DC

## 2023-07-01 MED ORDER — NIFEDIPINE ER OSMOTIC RELEASE 30 MG PO TB24
30.0000 mg | ORAL_TABLET | Freq: Every day | ORAL | Status: DC
  Administered 2023-07-02: 30 mg via ORAL
  Filled 2023-07-01: qty 1

## 2023-07-01 MED ORDER — LACTATED RINGERS IV SOLN
500.0000 mL | INTRAVENOUS | Status: DC | PRN
  Administered 2023-07-01: 500 mL via INTRAVENOUS

## 2023-07-01 MED ORDER — PENICILLIN G POT IN DEXTROSE 60000 UNIT/ML IV SOLN
3.0000 10*6.[IU] | INTRAVENOUS | Status: DC
  Administered 2023-07-01: 3 10*6.[IU] via INTRAVENOUS
  Filled 2023-07-01: qty 50

## 2023-07-01 MED ORDER — VARICELLA VIRUS VACCINE LIVE 1350 PFU/0.5ML IJ SUSR
0.5000 mL | Freq: Once | INTRAMUSCULAR | Status: AC
  Administered 2023-07-02: 0.5 mL via SUBCUTANEOUS
  Filled 2023-07-01 (×2): qty 0.5

## 2023-07-01 MED ORDER — SOD CITRATE-CITRIC ACID 500-334 MG/5ML PO SOLN: 30.0000 mL | ORAL | Status: DC | PRN

## 2023-07-01 MED ORDER — FENTANYL CITRATE (PF) 100 MCG/2ML IJ SOLN: 50.0000 ug | INTRAMUSCULAR | Status: DC | PRN

## 2023-07-01 MED ORDER — IBUPROFEN 600 MG PO TABS
600.0000 mg | ORAL_TABLET | Freq: Four times a day (QID) | ORAL | Status: DC
  Administered 2023-07-01 – 2023-07-02 (×4): 600 mg via ORAL
  Filled 2023-07-01 (×4): qty 1

## 2023-07-01 MED ORDER — OXYTOCIN 10 UNIT/ML IJ SOLN
INTRAMUSCULAR | Status: AC
  Filled 2023-07-01: qty 2

## 2023-07-01 MED ORDER — DIPHENHYDRAMINE HCL 25 MG PO CAPS
25.0000 mg | ORAL_CAPSULE | Freq: Four times a day (QID) | ORAL | Status: DC | PRN
Start: 2023-07-01 — End: 2023-07-03

## 2023-07-01 MED ORDER — BENZOCAINE-MENTHOL 20-0.5 % EX AERO
1.0000 | INHALATION_SPRAY | CUTANEOUS | Status: DC | PRN
  Administered 2023-07-01: 1 via TOPICAL
  Filled 2023-07-01 (×2): qty 56

## 2023-07-01 MED ORDER — TERBUTALINE SULFATE 1 MG/ML IJ SOLN: 0.2500 mg | Freq: Once | INTRAMUSCULAR | Status: DC | PRN

## 2023-07-01 MED ORDER — COCONUT OIL OIL
1.0000 | TOPICAL_OIL | Status: DC | PRN
  Administered 2023-07-02: 1 via TOPICAL
  Filled 2023-07-01 (×2): qty 7.5

## 2023-07-01 MED ORDER — SIMETHICONE 80 MG PO CHEW
80.0000 mg | CHEWABLE_TABLET | ORAL | Status: DC | PRN
Start: 2023-07-01 — End: 2023-07-03

## 2023-07-01 MED ORDER — AMMONIA AROMATIC IN INHA
RESPIRATORY_TRACT | Status: AC
  Filled 2023-07-01: qty 10

## 2023-07-01 MED ORDER — HYDROXYZINE HCL 25 MG PO TABS: 50.0000 mg | ORAL_TABLET | Freq: Four times a day (QID) | ORAL | Status: DC | PRN

## 2023-07-01 MED ORDER — DOCUSATE SODIUM 100 MG PO CAPS
100.0000 mg | ORAL_CAPSULE | Freq: Two times a day (BID) | ORAL | Status: DC
  Filled 2023-07-01: qty 1

## 2023-07-01 MED ORDER — FENTANYL-BUPIVACAINE-NACL 0.5-0.125-0.9 MG/250ML-% EP SOLN
12.0000 mL/h | EPIDURAL | Status: DC | PRN
  Administered 2023-07-01: 12 mL/h via EPIDURAL
  Filled 2023-07-01: qty 250

## 2023-07-01 MED ORDER — OXYTOCIN BOLUS FROM INFUSION
333.0000 mL | Freq: Once | INTRAVENOUS | Status: AC
  Administered 2023-07-01: 333 mL via INTRAVENOUS

## 2023-07-01 MED ORDER — EPHEDRINE 5 MG/ML INJ
10.0000 mg | INTRAVENOUS | Status: DC | PRN
Start: 2023-07-01 — End: 2023-07-01
  Administered 2023-07-01: 10 mg via INTRAVENOUS

## 2023-07-01 MED ORDER — MISOPROSTOL 50MCG HALF TABLET: 50.0000 ug | ORAL_TABLET | ORAL | Status: DC | PRN

## 2023-07-01 MED ORDER — MISOPROSTOL 25 MCG QUARTER TABLET: 25.0000 ug | ORAL_TABLET | Freq: Once | ORAL | Status: DC

## 2023-07-01 MED ORDER — DIBUCAINE (PERIANAL) 1 % EX OINT
1.0000 | TOPICAL_OINTMENT | CUTANEOUS | Status: DC | PRN
  Administered 2023-07-01: 1 via RECTAL
  Filled 2023-07-01 (×3): qty 28

## 2023-07-01 MED ORDER — SERTRALINE HCL 50 MG PO TABS
75.0000 mg | ORAL_TABLET | Freq: Every day | ORAL | Status: DC
Start: 2023-07-01 — End: 2023-07-03
  Administered 2023-07-01 – 2023-07-02 (×2): 75 mg via ORAL
  Filled 2023-07-01 (×2): qty 1.5
  Filled 2023-07-01: qty 3

## 2023-07-01 NOTE — Progress Notes (Addendum)
Labor Progress Note   ASSESSMENT/PLAN   Kristi Bauer 22 y.o.   [id number]  at [redacted]w[redacted]d here with PROM.  FWB:  - Fetal well being assessed: Cat II with occasional late decelerations and variables, resolved with position changes or spontaneously.      GBS: - GBS Positive/-- (11/06 1148)    LABOR: - Now in active labor, doing well. - Pain Management: epidural, feeling some left-sided pain/pressure - Discussed options with patient and will continue expectant management. Repositioned on left side to help resolve anterior lip of cervix. She has just recently started pushing her epidural button after not using it while sleeping. Will give some time for epidural PCA to catch up and if having continued pain, will notify anesthesia.  - Anticipate SVD   Active Problems: Chronic Hypertension - on 30 mg daily nifedipine Depression - On 75 mg daily sertraline  Labor Progress: 2300 PROM (MSF)  0240  4/100/0 0640  9.5/100/0  SUBJECTIVE/OBJECTIVE   SUBJECTIVE:  Was able to get some rest with epidural but now feeling more pain/pressure on left side.   OBJECTIVE: Vital Signs: Patient Vitals for the past 12 hrs:  BP Temp Temp src Pulse Resp Height Weight  07/01/23 0557 -- 99.2 F (37.3 C) Axillary -- -- -- --  07/01/23 0530 112/81 -- -- -- -- -- --  07/01/23 0430 (!) 102/42 -- -- -- -- -- --  07/01/23 0340 -- 98.6 F (37 C) Oral -- -- -- --  07/01/23 0330 (!) 83/54 -- -- -- -- -- --  07/01/23 0315 (!) 95/49 -- -- -- -- -- --  07/01/23 0232 (!) 120/95 -- -- -- -- -- --  07/01/23 0229 (!) 139/95 -- -- -- -- -- --  07/01/23 0224 (!) 145/64 -- -- -- -- -- --  07/01/23 0220 (!) 138/57 -- -- -- -- -- --  07/01/23 0131 90/69 98.8 F (37.1 C) Axillary 88 16 5\' 1"  (1.549 m) 87.1 kg    Last SVE:  Dilation: Lip/rim Dilation Complete Time: 0643 Effacement (%): 100 Station: 0 Exam by:: Kamla Skilton CNM -  , Rupture Date: 06/30/23, Rupture Time: 2330,    FHR:   - Mode: External  -  Baseline Rate (A): 115 bpm  -    - Characteristics (ie - accels, decels): Accelerations: 15 x 15  -    UTERINE ACTIVITY:   - Mode: Toco  - Contraction Frequency (min): 2-3 minutes

## 2023-07-01 NOTE — Anesthesia Procedure Notes (Signed)
Epidural Patient location during procedure: OB Start time: 07/01/2023 1:56 AM End time: 07/01/2023 2:05 AM  Staffing Anesthesiologist: Louie Boston, MD Performed: anesthesiologist   Preanesthetic Checklist Completed: patient identified, IV checked, site marked, risks and benefits discussed, surgical consent, monitors and equipment checked, pre-op evaluation and timeout performed  Epidural Patient position: sitting Prep: ChloraPrep Patient monitoring: heart rate, continuous pulse ox and blood pressure Approach: midline Location: L3-L4 Injection technique: LOR saline  Needle:  Needle type: Tuohy  Needle gauge: 17 G Needle length: 9 cm and 9 Needle insertion depth: 8 cm Catheter type: closed end flexible Catheter size: 19 Gauge Catheter at skin depth: 13 cm Test dose: negative and 1.5% lidocaine with Epi 1:200 K  Assessment Sensory level: T10 Events: blood not aspirated, injection not painful, no injection resistance, no paresthesia and negative IV test  Additional Notes 1 attempt Pt. Evaluated and documentation done after procedure finished. Patient identified. Risks/Benefits/Options discussed with patient including but not limited to bleeding, infection, nerve damage, paralysis, failed block, incomplete pain control, headache, blood pressure changes, nausea, vomiting, reactions to medication both or allergic, itching and postpartum back pain. Confirmed with bedside nurse the patient's most recent platelet count. Confirmed with patient that they are not currently taking any anticoagulation, have any bleeding history or any family history of bleeding disorders. Patient expressed understanding and wished to proceed. All questions were answered. Sterile technique was used throughout the entire procedure. Please see nursing notes for vital signs. Test dose was given through epidural catheter and negative prior to continuing to dose epidural or start infusion. Warning signs of high  block given to the patient including shortness of breath, tingling/numbness in hands, complete motor block, or any concerning symptoms with instructions to call for help. Patient was given instructions on fall risk and not to get out of bed. All questions and concerns addressed with instructions to call with any issues or inadequate analgesia.    Patient tolerated the insertion well without immediate complications. Reason for block:procedure for pain

## 2023-07-01 NOTE — Anesthesia Preprocedure Evaluation (Signed)
Anesthesia Evaluation  Patient identified by MRN, date of birth, ID band Patient awake    Reviewed: Allergy & Precautions, NPO status , Patient's Chart, lab work & pertinent test results  History of Anesthesia Complications Negative for: history of anesthetic complications  Airway Mallampati: III  TM Distance: >3 FB Neck ROM: full    Dental no notable dental hx.    Pulmonary neg pulmonary ROS   Pulmonary exam normal        Cardiovascular Exercise Tolerance: Good hypertension, On Medications Normal cardiovascular exam     Neuro/Psych  Headaches PSYCHIATRIC DISORDERS Anxiety Depression       GI/Hepatic negative GI ROS,,,  Endo/Other    Renal/GU   negative genitourinary   Musculoskeletal   Abdominal   Peds  Hematology negative hematology ROS (+)   Anesthesia Other Findings Past Medical History: 01/30/2014: Altered mental status 09/17/2022: Black stool No date: Miscarriage No date: Panic attacks 09/17/2022: Pregnancy, location unknown No date: RSV (respiratory syncytial virus infection)     Comment:  6 mo old, admitted at [facility]  Past Surgical History: 10/2021: WISDOM TOOTH EXTRACTION     Comment:  [age];     Reproductive/Obstetrics (+) Pregnancy                             Anesthesia Physical Anesthesia Plan  ASA: 2  Anesthesia Plan: Epidural   Post-op Pain Management:    Induction:   PONV Risk Score and Plan:   Airway Management Planned: Natural Airway  Additional Equipment:   Intra-op Plan:   Post-operative Plan:   Informed Consent: I have reviewed the patients History and Physical, chart, labs and discussed the procedure including the risks, benefits and alternatives for the proposed anesthesia with the patient or authorized representative who has indicated his/her understanding and acceptance.     Dental Advisory Given  Plan Discussed with:  Anesthesiologist  Anesthesia Plan Comments: (Patient reports no bleeding problems and no anticoagulant use.   Patient consented for risks of anesthesia including but not limited to:  - adverse reactions to medications - risk of bleeding, infection and or nerve damage from epidural that could lead to paralysis - risk of headache or failed epidural - nerve damage due to positioning - that if epidural is used for C-section that there is a chance of epidural failure requiring spinal placement or conversion to GA - Damage to heart, brain, lungs, other parts of body or loss of life  Patient voiced understanding and assent.)       Anesthesia Quick Evaluation

## 2023-07-01 NOTE — Discharge Summary (Signed)
Postpartum Discharge Summary  Date of Service updated 07/02/23      Patient Name: Kristi Bauer DOB: 2001-05-31 MRN: [id number]  Date of admission: 06/30/2023 Delivery date:07/01/2023 Delivering provider: [name] Date of discharge: 07/02/2023  Admitting diagnosis: Encounter for induction of labor [Z34.90] Intrauterine pregnancy: [redacted]w[redacted]d     Secondary diagnosis:  Principal Problem:   Premature rupture of membranes Active Problems:   Depression, major, single episode, mild (HCC)   Supervision of other normal pregnancy, antepartum   Chronic hypertension complicating or reason for care during pregnancy, third trimester   Maternal varicella, non-immune   Postpartum care following vaginal delivery  Additional problems: none    Discharge diagnosis: Term Pregnancy Delivered and CHTN                                              Post partum procedures: none Augmentation: N/A Complications: None  Hospital course: Onset of Labor With Vaginal Delivery      22 y.o. yo [id number] at [redacted]w[redacted]d was admitted in Active Labor on 06/30/2023. Labor course was complicated by meconium stained fluid  Membrane Rupture Time/Date: 11:30 PM,06/30/2023  Delivery Method:Vaginal, Spontaneous Operative Delivery:N/A Episiotomy: None Lacerations:  2nd degree;Perineal Patient had an uncomplicated postpartum course.  She is ambulating, tolerating a regular diet, passing flatus, and urinating well. Patient is discharged home in stable condition on 07/02/23.  Newborn Data: Birth date:07/01/2023 Birth time:12:34 PM Gender:Female Living status:Living Apgars:8 ,9  Weight:3400 g  Magnesium Sulfate received: No BMZ received: No Rhophylac:N/A MMR:N/A T-DaP:Given prenatally Flu: Given prenatally RSV Vaccine received: Given prenatally Transfusion:No Immunizations administered: Immunization History  Administered Date(s) Administered   DTaP 06/21/2002, 08/23/2002, 10/26/2002, 08/13/2003,  03/24/2007   HIB (PRP-OMP) 06/21/2002, 08/23/2002, 10/26/2002, 04/23/2003   HPV Quadrivalent 03/20/2014, 06/22/2014, 10/19/2014   Hepatitis A, Ped/Adol-2 Dose 03/24/2007, 09/16/2011   Hepatitis B, PED/ADOLESCENT Jun 17, 2002, 05/29/2002, 01/26/2003   IPV 06/21/2002, 08/23/2002, 01/26/2003, 03/24/2007   Influenza-Unspecified 07/03/2022, 05/20/2023   MMR 04/23/2003, 03/24/2007   Meningococcal B, OMV 05/22/2019, 06/05/2021   Meningococcal Mcv4o 03/21/2015, 05/22/2019   PFIZER(Purple Top)SARS-COV-2 Vaccination 08/31/2020, 09/21/2020   Pneumococcal Conjugate PCV 7 06/21/2002, 08/23/2002, 04/23/2003, 08/13/2003   Rsv, Bivalent, Protein Subunit Rsvpref,pf Verdis Frederickson) 05/19/2023   Tdap 03/23/2012, 06/05/2021, 04/19/2023   Varicella 04/23/2003, 03/24/2007    Physical exam  Vitals:   07/01/23 1932 07/02/23 0059 07/02/23 0532 07/02/23 0753  BP: 137/83 132/83 139/81 122/88  Pulse: 83 77 86 78  Resp: 17 18 17 18   Temp: 98.1 F (36.7 C) 98 F (36.7 C) 98.1 F (36.7 C) 98.6 F (37 C)  TempSrc: Axillary Axillary Axillary Oral  SpO2: 99% 98% 100%   Weight:      Height:       General: alert, cooperative, and no distress Lochia: appropriate Uterine Fundus: firm Incision: Healing well with no significant drainage, No significant erythema DVT Evaluation: No evidence of DVT seen on physical exam. Labs: Lab Results  Component Value Date   WBC 14.6 (H) 07/02/2023   HGB 11.5 (L) 07/02/2023   HCT 34.7 (L) 07/02/2023   MCV 89.4 07/02/2023   PLT 133 (L) 07/02/2023      Latest Ref Rng & Units 05/10/2023    4:27 PM  CMP  Glucose 70 - 99 mg/dL 77   BUN 6 - 20 mg/dL 10   Creatinine 9.56 - 1.00 mg/dL 2.13  Sodium 135 - 145 mmol/L 133   Potassium 3.5 - 5.1 mmol/L 3.7   Chloride 98 - 111 mmol/L 105   CO2 22 - 32 mmol/L 22   Calcium 8.9 - 10.3 mg/dL 9.5   Total Protein 6.5 - 8.1 g/dL 6.9   Total Bilirubin 0.3 - 1.2 mg/dL 0.4   Alkaline Phos 38 - 126 U/L 119   AST 15 - 41 U/L 16   ALT 0 - 44  U/L 17    Edinburgh Score:    11/26/2022    2:45 PM  Edinburgh Postnatal Depression Scale Screening Tool  I have been able to laugh and see the funny side of things. 0  I have looked forward with enjoyment to things. 0  I have blamed myself unnecessarily when things went wrong. 0  I have been anxious or worried for no good reason. 0  I have felt scared or panicky for no good reason. 0  Things have been getting on top of me. 0  I have been so unhappy that I have had difficulty sleeping. 0  I have felt sad or miserable. 0  I have been so unhappy that I have been crying. 0  The thought of harming myself has occurred to me. 0  Edinburgh Postnatal Depression Scale Total 0      After visit meds:  Allergies as of 07/02/2023   No Known Allergies      Medication List     STOP taking these medications    aspirin 81 MG chewable tablet Commonly known as: Aspirin Childrens       TAKE these medications    Blood Pressure Kit Kit Use as directed   metoCLOPramide 10 MG tablet Commonly known as: REGLAN Take 1 tablet (10 mg total) by mouth every 6 (six) hours as needed (for headache).   NIFEdipine 30 MG 24 hr tablet Commonly known as: PROCARDIA-XL/NIFEDICAL-XL Take 1 tablet (30 mg total) by mouth daily.   PRENATAL PO Take by mouth.   sertraline 50 MG tablet Commonly known as: Zoloft Take 1.5 tablets (75 mg total) by mouth daily.         Discharge home in stable condition Infant Feeding: Breast Infant Disposition:home with mother Discharge instruction: per After Visit Summary and Postpartum booklet. Activity: Advance as tolerated. Pelvic rest for 6 weeks.  Diet: routine diet Anticipated Birth Control: Unsure Postpartum Appointment:6 weeks Additional Postpartum F/U: BP check 1 week Future Appointments:No future appointments. Follow up Visit:  Follow-up Information     [name], CNM. Schedule an appointment as soon as possible for a visit.    Specialty: Certified Nurse Midwife Why: BP/mood check in one week Office visit in 6 weeks Contact information: [address] [place] [address] [phone]                     07/02/2023 Glenetta Borg, CNM

## 2023-07-01 NOTE — H&P (Signed)
[facility] Labor & Delivery  History and Physical  ASSESSMENT AND PLAN   Kristi Bauer is a 22 y.o. G3P0020 at [redacted]w[redacted]d with EDD: 07/06/2023, by LMP confirmed with 6 week ultrasound, admitted for premature rupture of membranes.  Labor Plan - Currently contracting every 2 minutes, plan for expectant management with Pitocin augmentation if contractions space out.  - Patient declining cervical exam on admission. Desires to wait for epidural placement. Will plan for SVE once comfortable with epidural.  - Anticipate SVD.   Other Active OB Problems: Chronic Hypertension - Blood pressures stable on 30 mg nifedipine daily. - Normotensive on admission to L&D  Depression - Currently on 75mg  sertraline daily.  Fetal Status: - cephalic presentation by Leopold's and BSUS - EFW: 7.5 lbs by Leopold's. - Continuous FHR monitoring - FHT currently cat I   Labs/Immunizations: Prenatal labs and studies: ABO, Rh: B/Positive/-- (05/14 0859) Antibody: Negative (05/14 0859) Rubella: 1.52 (05/14 0859) Varicella: <135 (05/14 0859)  RPR: Non Reactive (09/16 0934)  HBsAg: Negative (05/14 0859)  HepC: Non Reactive (05/14 0859) HIV: Non Reactive (09/16 0934)  GBS: Positive/-- (11/06 1148), prophylaxis with penicillin ordered  TDAP: 04/19/23 Flu: 05/20/23 RSV: yes, 05/19/23  Postpartum Plan: - Feeding: Breast Milk - Contraception: plans undecided - Prenatal Care Provider: AOB     HPI   Chief Complaint: Leaking fluid  Kristi Bauer is a 22 y.o. G3P0020 at [redacted]w[redacted]d who presents for leaking amniotic fluid. Patient states she had a  gush of green-tinged fluid  at approximately 2300 on 11/27. She started contracting after the gush of fluid and is now painfully contracting every 2 minutes. She denies vaginal bleeding.    Pregnancy Complications Patient Active Problem List   Diagnosis Date Noted   Premature rupture of membranes 07/01/2023   Headache 05/10/2023    Maternal varicella, non-immune 04/16/2023   Chronic hypertension complicating or reason for care during pregnancy, third trimester 03/19/2023   Supervision of other normal pregnancy, antepartum 11/26/2022   Depression, major, single episode, mild (HCC) 09/17/2022    Review of Systems A twelve point review of systems was negative except as stated in HPI.   HISTORY   Medications Medications Prior to Admission  Medication Sig Dispense Refill Last Dose   aspirin (ASPIRIN CHILDRENS) 81 MG chewable tablet Chew 1 tablet (81 mg total) by mouth daily. 90 tablet 3    Blood Pressure Monitoring (BLOOD PRESSURE KIT) KIT Use as directed 1 kit 0    metoCLOPramide (REGLAN) 10 MG tablet Take 1 tablet (10 mg total) by mouth every 6 (six) hours as needed (for headache). 40 tablet 1    NIFEdipine (PROCARDIA-XL/NIFEDICAL-XL) 30 MG 24 hr tablet Take 1 tablet (30 mg total) by mouth daily. 90 tablet 1    Prenatal Vit-Fe Fumarate-FA (PRENATAL PO) Take by mouth.      sertraline (ZOLOFT) 50 MG tablet Take 1.5 tablets (75 mg total) by mouth daily. 45 tablet 3     Allergies has No Known Allergies.   OB History OB History  Gravida Para Term Preterm AB Living  3 0 0 0 2 0  SAB IAB Ectopic Multiple Live Births  2 0 0 0 0    # Outcome Date GA Lbr Len/2nd Weight Sex Type Anes PTL Lv  3 Current           2 SAB 06/28/22     SAB     1 SAB 09/29/21     SAB  Past Medical History Past Medical History:  Diagnosis Date   Altered mental status 01/30/2014   Black stool 09/17/2022   Miscarriage    Panic attacks    Pregnancy, location unknown 09/17/2022   RSV (respiratory syncytial virus infection)    6 mo old, admitted at [facility]    Past Surgical History Past Surgical History:  Procedure Laterality Date   WISDOM TOOTH EXTRACTION  10/2021   [age];    Social History  reports that she has never smoked. She has never used smokeless tobacco. She reports that she does not drink alcohol and does not use  drugs.   Family History family history includes Cancer in her maternal aunt, maternal grandmother, and paternal grandfather; Healthy in her brother and sister; Heart disease in her maternal aunt, maternal grandmother, paternal grandmother, and paternal uncle; Hypertension in her father, maternal grandmother, mother, and paternal grandmother.   PHYSICAL EXAM   There were no vitals filed for this visit.  Constitutional: No acute distress, well appearing, and well nourished. Neurologic: She is alert and conversational.  Psychiatric: She has a normal mood and affect.  Musculoskeletal: Normal gait, grossly normal range of motion Cardiovascular: Normal rate.   Pulmonary/Chest: Normal work of breathing.  Gastrointestinal/Abdominal: Soft. Gravid. There is no tenderness.  Skin: Skin is warm and dry. No rash noted.  Genitourinary: Normal external female genitalia.  SVE:  deferred until after epidural placement per patient request   SSE: deferred  NST Interpretation Indication: Rupture of membranes Baseline: 120 bpm Variability: moderate Accelerations: present Decelerations: absent Contractions: regular, every 2 minutes Time noted:  See OBIX Impression: reactive Authenticated by: Lindalou Hose Durwood Dittus   Attending Dr. Logan Bores was immediately available for the care of the patient.   Lindalou Hose Koron Godeaux, CNM

## 2023-07-01 NOTE — Lactation Note (Signed)
This note was copied from a baby's chart. Lactation Consultation Note  Patient Name: Kristi Bauer [id number] Date: 07/01/2023 Age:21 hours Reason for consult: Primapara;Term;L&D Initial assessment   Maternal Data Does the patient have breastfeeding experience prior to this delivery?: No  Initial assessment in L&D w/ a P1 patient and a 2hr old baby Kristi.  This was a SVD.  Patient w/ a hx of chronic hypertension.  Feeding goal is breastfeeding.   Patient stated that she has a Dr. Theora Gianotti breastpump.   Feeding Mother's Current Feeding Choice: Breast Milk  Infant has gone to the breast twice.  Upon entry, infant latched to the rt breast in cradle hold.  Lips flanged out with active feeding and audible swallows heard.   LATCH Score Latch: Grasps breast easily, tongue down, lips flanged, rhythmical sucking.  Audible Swallowing: Spontaneous and intermittent  Type of Nipple: Everted at rest and after stimulation  Comfort (Breast/Nipple): Soft / non-tender  Hold (Positioning): No assistance needed to correctly position infant at breast.  LATCH Score: 10  Interventions Interventions: Breast feeding basics reviewed  Education was provided on the first 24hrs of life.  Feeding infant on demand at least 8x w/in a 24hr period but not to go over 3hrs.  Providing education on waking infant up at 3hrs to attempt a feeding session.  Education on STS benefits was provided as well.   Discharge Pump: DEBP;Personal (Dr. Manson Passey)  Consult Status Consult Status: Follow-up from L&D Follow-up type: In-patient    Kristi Bauer 07/01/2023, 3:28 PM

## 2023-07-02 LAB — CBC
HCT: 34.7 % — ABNORMAL LOW (ref 36.0–46.0)
Hemoglobin: 11.5 g/dL — ABNORMAL LOW (ref 12.0–15.0)
MCH: 29.6 pg (ref 26.0–34.0)
MCHC: 33.1 g/dL (ref 30.0–36.0)
MCV: 89.4 fL (ref 80.0–100.0)
Platelets: 133 10*3/uL — ABNORMAL LOW (ref 150–400)
RBC: 3.88 MIL/uL (ref 3.87–5.11)
RDW: 13.5 % (ref 11.5–15.5)
WBC: 14.6 10*3/uL — ABNORMAL HIGH (ref 4.0–10.5)
nRBC: 0 % (ref 0.0–0.2)

## 2023-07-02 NOTE — Anesthesia Postprocedure Evaluation (Signed)
Anesthesia Post Note  Patient: Kristi Bauer  Procedure(s) Performed: AN AD HOC LABOR EPIDURAL  Patient location during evaluation: Mother Baby Anesthesia Type: Epidural Level of consciousness: awake and alert Pain management: pain level controlled Vital Signs Assessment: post-procedure vital signs reviewed and stable Respiratory status: spontaneous breathing, nonlabored ventilation and respiratory function stable Cardiovascular status: blood pressure returned to baseline and stable Postop Assessment: no apparent nausea or vomiting, able to ambulate, no headache and no backache Anesthetic complications: no   No notable events documented.   Last Vitals:  Vitals:   07/02/23 0532 07/02/23 0753  BP: 139/81 122/88  Pulse: 86 78  Resp: 17 18  Temp: 36.7 C 37 C  SpO2: 100%     Last Pain:  Vitals:   07/02/23 0753  TempSrc: Oral  PainSc:                  Foye Deer

## 2023-07-02 NOTE — Discharge Instructions (Signed)
Call the office to schedule appointment for mood check in 1 week and postpartum visit in 6 weeks.  If you have questions or concerns you should call the office or the on call provider.  If you have urgent concerns go to the nearest emergency department for evaluation.

## 2023-07-02 NOTE — Progress Notes (Signed)
Discharge instructions reviewed with patient and significant other.  Follow up care reviewed and questions answered. Printed copies given to patient for reference after discharge home.

## 2023-07-02 NOTE — Final Progress Note (Signed)
Post Partum Day 1 Subjective: Kristi Bauer is feeling well overall. She is ambulating, voiding, and tolerating POs without difficulty. Her pain is well-controlled and her bleeding is WNL. She denies HA, visual changes, and RUQ pain. She has been normotensive. Her mood is stable. She is having some pain with breastfeeding but reports baby is getting a good latch.  Objective: Blood pressure 122/88, pulse 78, temperature 98.6 F (37 C), temperature source Oral, resp. rate 18, height 5\' 1"  (1.549 m), weight 87.1 kg, last menstrual period 09/29/2022, SpO2 100%, unknown if currently breastfeeding.  Physical Exam:  General: alert and cooperative Heart: RRR Lungs: CTAB Lochia: appropriate Uterine Fundus: firm Perineum: healing well DVT Evaluation: No evidence of DVT seen on physical exam.  Recent Labs    07/01/23 0108 07/02/23 0607  HGB 13.1 11.5*  HCT 39.3 34.7*    Assessment/Plan: Lactation assistance today Discharge this afternoon if BPs remain stable BP check in one week, PP office visit in 6 weeks Discharge instructions reviewed   LOS: 1 day   Glenetta Borg, CNM 07/02/2023, 9:26 AM

## 2023-07-02 NOTE — Lactation Note (Signed)
This note was copied from a baby's chart. Lactation Consultation Note  Patient Name: Kristi Bauer [id number] Date: 07/02/2023 Age:21 hours Reason for consult: Follow-up assessment;Primapara;Term;Nipple pain/trauma   Maternal Data P1 patient and a 26hr old baby Kristi. Patient stated that her nipples are very sore and were bleeding earlier.  She does not notice that her nipples are mis-shaped when they come out of his mouth.   Feeding Mother's Current Feeding Choice: Breast Milk  Infant latched to the left breast in cradle hold.  Wide open gape, audible swallows heard.  Patient appropriately de-latched infant nipple round with no damage.  LATCH Score Latch: Grasps breast easily, tongue down, lips flanged, rhythmical sucking.  Audible Swallowing: Spontaneous and intermittent  Type of Nipple: Everted at rest and after stimulation  Comfort (Breast/Nipple): Filling, red/small blisters or bruises, mild/mod discomfort  Hold (Positioning): No assistance needed to correctly position infant at breast.  LATCH Score: 9  Interventions Interventions: Breast feeding basics reviewed;Breast massage;Hand express;Breast compression;Adjust position;Support pillows;Coconut oil;Comfort gels;Education;CDC Guidelines for Breast Pump Cleaning  Education on nipple care involving; coconut oil, breast shells (lactation out of), resting nipples and hand expressing colostrum on nipples.  Discharge Discharge Education: Engorgement and breast care;Outpatient recommendation;Other (comment) (Community resources - [employer]) Pump: Personal  Education on engorgement prevention/treatment was discussed as well as breastmilk storage guidelines.  LC provided patient with a handout on breastmilk storage guidelines from [facility]. [facility] outpatient lactation services phone number written on the white board in the room.  Patient verbalized understanding.  Consult Status Consult Status: Complete Follow-up type: Call as  needed    [name] Kristi Norment 07/02/2023, 3:51 PM

## 2023-07-05 ENCOUNTER — Encounter: Payer: Self-pay | Admitting: Licensed Practical Nurse

## 2023-07-06 ENCOUNTER — Inpatient Hospital Stay: Admit: 2023-07-06 | Payer: BC Managed Care – PPO

## 2023-07-07 ENCOUNTER — Encounter: Payer: Self-pay | Admitting: Certified Nurse Midwife

## 2023-07-07 ENCOUNTER — Ambulatory Visit (INDEPENDENT_AMBULATORY_CARE_PROVIDER_SITE_OTHER): Payer: BC Managed Care – PPO | Admitting: Certified Nurse Midwife

## 2023-07-07 DIAGNOSIS — Z1332 Encounter for screening for maternal depression: Secondary | ICD-10-CM | POA: Diagnosis not present

## 2023-07-07 DIAGNOSIS — O10919 Unspecified pre-existing hypertension complicating pregnancy, unspecified trimester: Secondary | ICD-10-CM

## 2023-07-07 NOTE — Progress Notes (Signed)
   SUBJECTIVE  Kristi Bauer is a 22 y.o. [phone] who gave birth to a female infant, Salley Slaughter, at 39+2 weeks via   NSVD on 07/01/23 with epidural anesthesia.  She is breast feeding. She is here today for a PP mood & blood pressure follow up. EPDS today 7. Endorses good appetite. Denies incontinence or constipation. Denies perineal pain. Lochia light without clots or odor. Endorses good support from partner & her mother. Baby [name] going to breast and she is also pumping so that family can bottle feed expressed milk, occasional pain with latch.     Denies difficulty breathing, chest pain, lower extremity pain or swelling, excessive vaginal bleeding, vaginal pain.    OBJECTIVE Last Weight  Most recent update: 07/07/2023 10:03 AM    Weight  82.6 kg (182 lb)            Body mass index is 33.83 kg/m.  BP 112/72   Pulse 75   Ht 5' 1.5" (1.562 m)   Wt 182 lb (82.6 kg)   LMP 09/29/2022 (Exact Date)   Breastfeeding Yes   BMI 33.83 kg/m  Patient has no known allergies.  Past Medical/Surgical History Past Medical History:  Diagnosis Date   Altered mental status 01/30/2014   Black stool 09/17/2022   Miscarriage    Panic attacks    Pregnancy, location unknown 09/17/2022   RSV (respiratory syncytial virus infection)    6 mo old, admitted at [facility]   Past Surgical History:  Procedure Laterality Date   WISDOM TOOTH EXTRACTION  10/2021   four;   Physical Exam Constitutional:      Appearance: Normal appearance.  Cardiovascular:     Rate and Rhythm: Normal rate.  Pulmonary:     Effort: Pulmonary effort is normal.  Neurological:     General: No focal deficit present.     Mental Status: She is alert and oriented to person, place, and time.  Psychiatric:        Mood and Affect: Mood normal.        Behavior: Behavior normal.      ASSESSMENT/PLAN 1. Routine postpartum follow-up  2. Chronic hypertension affecting pregnancy  3. Encounter for screening for maternal  depression   -Continue nifedipine at 30mg  daily -Options for contraception reviewed, Paragard information sent via MyChart. -EPDS today 7, continue on current dose of Zoloft, protected sleep encouraged, rescreen at 6w PP.  [name], CNM

## 2023-07-15 ENCOUNTER — Other Ambulatory Visit: Payer: Self-pay | Admitting: Licensed Practical Nurse

## 2023-07-26 DIAGNOSIS — F331 Major depressive disorder, recurrent, moderate: Secondary | ICD-10-CM | POA: Diagnosis not present

## 2023-08-02 DIAGNOSIS — F331 Major depressive disorder, recurrent, moderate: Secondary | ICD-10-CM | POA: Diagnosis not present

## 2023-08-05 ENCOUNTER — Encounter: Payer: Self-pay | Admitting: Certified Nurse Midwife

## 2023-08-05 ENCOUNTER — Ambulatory Visit: Payer: BC Managed Care – PPO | Admitting: Certified Nurse Midwife

## 2023-08-05 DIAGNOSIS — Z1332 Encounter for screening for maternal depression: Secondary | ICD-10-CM

## 2023-08-05 MED ORDER — POLYMYXIN B-TRIMETHOPRIM 10000-0.1 UNIT/ML-% OP SOLN: 1.0000 [drp] | OPHTHALMIC | 0 refills | Status: AC

## 2023-08-05 NOTE — Progress Notes (Signed)
 Post Partum Visit Note  Kristi Bauer is a 23 y.o. [phone] female who presents for a postpartum visit. She is 6 weeks postpartum following a normal spontaneous vaginal delivery.  I have fully reviewed the prenatal and intrapartum course. The delivery was at 39+2 gestational weeks.  Anesthesia: epidural. Postpartum course has been uncomplicated. Stopped nifedipine  & blood pressures have normalized. Baby boy Derick is doing well. Baby is feeding by both breast and bottle - pumped breastmilk, reports he has been struggling to latch well recently both at breast & on the bottle. They have tried different nipples, he had a cold two weeks ago and this has worsened since.  Bleeding no bleeding. Bowel function is normal. Bladder function is normal. Patient is not sexually active. Contraception method is abstinence. Postpartum depression screening: equivocal at 10, continues on daily Zoloft .   Upstream - 08/05/23 1019       Contraception Wrap Up   End Method Abstinence    Contraception Counseling Provided No    How was the end contraceptive method provided? N/A            The pregnancy intention screening data noted above was reviewed. Potential methods of contraception were discussed. The patient elected to proceed with Abstinence. Considering Paragard IUD and will call for placement when desired.   Edinburgh Postnatal Depression Scale - 08/05/23 1101       Edinburgh Postnatal Depression Scale:  In the Past 7 Days   I have been able to laugh and see the funny side of things. 0    I have looked forward with enjoyment to things. 0    I have blamed myself unnecessarily when things went wrong. 1    I have been anxious or worried for no good reason. 2    I have felt scared or panicky for no good reason. 2    Things have been getting on top of me. 2    I have been so unhappy that I have had difficulty sleeping. 1    I have felt sad or miserable. 1    I have been so unhappy that I have  been crying. 1    The thought of harming myself has occurred to me. 0    Edinburgh Postnatal Depression Scale Total 10             Health Maintenance Due  Topic Date Due   COVID-19 Vaccine (3 - 2024-25 season) 04/04/2023   Cervical Cancer Screening (Pap smear)  Never done    The following portions of the patient's history were reviewed and updated as appropriate: allergies, current medications, past family history, past medical history, past social history, past surgical history, and problem list.  Review of Systems Pertinent items are noted in HPI.  Objective:  BP 119/70   Pulse (!) 101   Wt 175 lb 11.2 oz (79.7 kg)   Breastfeeding Yes   BMI 32.66 kg/m    General:  alert, cooperative, appears stated age, and no distress   Breasts:  normal  Lungs: clear to auscultation bilaterally  Heart:  regular rate and rhythm, S1, S2 normal, no murmur, click, rub or gallop  Abdomen: Soft, nontender, <81fb DR    GU exam:   Laceration well healed       Assessment:    1. Routine postpartum follow-up   2. Postpartum care and examination of lactating mother   3. Encounter for screening for maternal depression    Normal postpartum exam.  Plan:   Essential components of care per ACOG recommendations:  1.  Mood and well being: Patient with equivocal depression screening today. Continue Zoloft . - Patient tobacco use? No.   - hx of drug use? No.    2. Infant care and feeding:  -Patient currently breastmilk feeding? Yes. Discussed returning to work and pumping.  -Social determinants of health (SDOH) reviewed in EPIC. No concerns.  3. Sexuality, contraception and birth spacing - Patient does not want a pregnancy in the next year.  Desired family size is 2 children.  - Reviewed reproductive life planning. Reviewed contraceptive methods based on pt preferences and effectiveness.  Patient desired Abstinence today.   - Discussed birth spacing of 18 months  4. Sleep and  fatigue -Encouraged family/partner/community support of 4 hrs of uninterrupted sleep to help with mood and fatigue  5. Physical Recovery  - Discussed patients delivery and complications. She describes her labor as good. - Patient had a Vaginal, no problems at delivery. Patient had a 2nd degree laceration. Perineal healing reviewed. Patient expressed understanding - Patient has urinary incontinence? No. - Patient is safe to resume physical and sexual activity  6.  Health Maintenance - HM due items addressed Yes - Last pap smear No results found for: DIAGPAP Pap smear not done at today's visit. She will call to schedule annual exam with pap in the next 6-23m -Breast Cancer screening indicated? No.   7. Chronic Disease/Pregnancy Condition follow up: None  - PCP follow up  Harlene LITTIE Cisco, CNM [facility], [facility]

## 2023-08-09 DIAGNOSIS — F331 Major depressive disorder, recurrent, moderate: Secondary | ICD-10-CM | POA: Diagnosis not present

## 2023-08-30 DIAGNOSIS — F331 Major depressive disorder, recurrent, moderate: Secondary | ICD-10-CM | POA: Diagnosis not present

## 2023-09-06 DIAGNOSIS — F331 Major depressive disorder, recurrent, moderate: Secondary | ICD-10-CM | POA: Diagnosis not present

## 2023-09-14 DIAGNOSIS — F331 Major depressive disorder, recurrent, moderate: Secondary | ICD-10-CM | POA: Diagnosis not present

## 2023-09-20 DIAGNOSIS — F331 Major depressive disorder, recurrent, moderate: Secondary | ICD-10-CM | POA: Diagnosis not present

## 2023-09-29 ENCOUNTER — Ambulatory Visit: Payer: Self-pay | Admitting: Physician Assistant

## 2023-09-29 ENCOUNTER — Telehealth: Payer: BC Managed Care – PPO | Admitting: Physician Assistant

## 2023-09-29 ENCOUNTER — Encounter: Payer: Self-pay | Admitting: Physician Assistant

## 2023-09-29 DIAGNOSIS — Z9189 Other specified personal risk factors, not elsewhere classified: Secondary | ICD-10-CM | POA: Diagnosis not present

## 2023-09-29 DIAGNOSIS — K529 Noninfective gastroenteritis and colitis, unspecified: Secondary | ICD-10-CM

## 2023-09-29 NOTE — Progress Notes (Signed)
 MyChart Video Visit  Virtual Visit via Video Note   This format is felt to be most appropriate for this patient at this time. Physical exam was limited by quality of the video and audio technology used for the visit.   Provider location: Virtual Visit Location Provider: Office/Clinic Patient Location: Home  I discussed the limitations of evaluation and management by telemedicine and the availability of in person appointments. The patient expressed understanding and agreed to proceed.  Patient: Kristi Bauer   DOB: Oct 02, 2000   23 y.o. Female  MRN: [id number] Visit Date: 09/29/2023  Today's healthcare provider: Debera Lat, PA-C   Chief Complaint  Patient presents with   Acute Visit    "Stomach bug" since yesterday   Subjective     Discussed the use of AI scribe software for clinical note transcription with the patient, who gave verbal consent to proceed.  History of Present Illness   The patient, a breastfeeding mother three months postpartum, presents with acute onset diarrhea and vomiting since the previous night. The diarrhea is described as 'watery,' 'yellowish green,' and 'exploding,' occurring every ten minutes. The patient also reports frequent vomiting, even when attempting to hydrate. She has been unable to eat since the onset of symptoms and has had a fever of 100F. 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         Medications: Outpatient Medications Prior to Visit  Medication Sig   Blood Pressure Monitoring (BLOOD PRESSURE KIT) KIT Use as directed   NIFEdipine (PROCARDIA-XL/NIFEDICAL-XL) 30 MG 24 hr tablet Take 1 tablet (30 mg total) by mouth daily. (Patient not taking: Reported on 08/05/2023)   Prenatal Vit-Fe Fumarate-FA (PRENATAL PO) Take by mouth.   sertraline (ZOLOFT) 50 MG tablet TAKE 1 AND 1/2 TABLETS BY MOUTH DAILY   No facility-administered medications prior to visit.    Review of Systems  All  other systems reviewed and are negative.  All negative  Except see HPI       Objective    There were no vitals taken for this visit.       Physical Exam Constitutional:      General: She is not in acute distress.    Appearance: Normal appearance.  HENT:     Head: Normocephalic.  Pulmonary:     Effort: Pulmonary effort is normal. No respiratory distress.  Neurological:     Mental Status: She is alert and oriented to person, place, and time. Mental status is at baseline.     Constitutional:      General: She is not in acute distress.    Appearance: Normal appearance.  HENT:     Head: Normocephalic.  Pulmonary:     Effort: Pulmonary effort is normal. No respiratory distress.  Neurological:     Mental Status: She is alert and oriented to person, place, and time. Mental status is at baseline.      Assessment and Plan    Acute Gastroenteritis Severe diarrhea and vomiting since last night, with a history of fever. Likely viral etiology given the patient's work in a retirement home. No blood in stool. -Continue fluid intake as tolerated, ideally with Pedialyte for electrolyte replacement. -Attempt bland diet (rice, apple sauce, bananas) as tolerated. -Use Tylenol for fever as needed. -Check temperature regularly. -If symptoms persist, consider further evaluation.  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 -Provide work  excuse from yesterday until next Monday. -Schedule follow-up appointment on Monday to reassess condition.  Breastfeeding Patient is currently breastfeeding and concerned about maintaining adequate nutrition. -Encourage intake of bland foods and fluids as tolerated. -Reassess nutritional status at follow-up appointment.       No follow-ups on file.     I discussed the assessment and treatment plan with the patient. The patient was provided an opportunity to ask questions and  all were answered. The patient agreed with the plan and demonstrated an understanding of the instructions.   The patient was advised to call back or seek an in-person evaluation if the symptoms worsen or if the condition fails to improve as anticipated.  I, Debera Lat, PA-C have reviewed all documentation for this visit. The documentation on 09/29/2023  for the exam, diagnosis, procedures, and orders are all accurate and complete.  Debera Lat, [facility], MMS [facility] [phone] (phone) [phone] (fax)  [facility]

## 2023-09-29 NOTE — Telephone Encounter (Signed)
  Chief Complaint: N/V  Symptoms: n/v, diarrhea Frequency: After eating/drinking Pertinent Negatives: Patient denies Headache, chills, fever, sore throat Disposition: [] ED /[] Urgent Care (no appt availability in office) / [x] Appointment(In office/virtual)/ []  [facility]/ [] Home Care/ [] Refused Recommended Disposition /[] [facility]/ []  Follow-up with PCP Additional Notes: Patient contacted with complaints of nausea/vomiting and diarrhea. Patient states that symptoms started last night with chills, muscle aches, fever,  diarrhea/vomiting, and headache. Patient states she has gone a total of 10 times with runny diarrhea, and about 6 times with emesis that is now yellow-greenish color.  Patient states that the chills, body aches, and headache subsided and last known temp was "100 point something" this AM. Patient is having emesis after drinking consistently and states amount is "a lot" regarding diarrhea but is still urinating. Patient denies abdominal pain, current chills, muscle aches, or headache. Patient is 3 months postpartum, vaginal delivery, breast feeding, and has not taken any medications due to "I didn't know what I could take." Patient advised by this RN to be seen today per protocol to which patient was agreeable. Appt scheduled. Patient advised by this RN tylenol, ginger tea or peppermint oil can be taken in the meantime and to call back for worsening symptoms. Patient verbalized understanding.   Copied from CRM [id number]. Topic: Clinical - Medical Advice >> Sep 29, 2023  9:29 AM Patsy Lager T wrote: Reason for CRM: patient stated she has a stomach bug. She has chills, aching muscles, headache and vomiting but she is breastfeeding. Patient would like to know what she can take. Reason for Disposition  Fever 100.4 F (38.0 C) or higher  Answer Assessment - Initial Assessment Questions 1. LEVEL: "What is the most recent temperature?"      "100 point Something " this AM 2.  MEASUREMENT: "How was it measured?"      Digital thermometer 3. ONSET: "When did the fever start?"      Last night  4. CHILLS: "Do you have chills?" If yes: "How bad are they?"  (e.g., none, mild, moderate, severe)   - NONE: no chills   - MILD: feeling cold   - MODERATE: feeling very cold, some shivering (feels better under a thick blanket)   - SEVERE: feeling extremely cold with shaking chills (general body shaking, rigors; even under a thick blanket)      "This morning they went away, they were there last night" 5. OTHER SYMPTOMS: "Do you have any other symptoms besides the fever?"  (e.g., abdomen pain, breast pain, cough, diarrhea, urination pain, vaginal discharge)     Diarrhea (10 times today), vomiting - greenish yellowish (6 times and every time after drinking) 6. DELIVERY DATE: "When was your delivery date?" "Vaginal delivery or C-section?"      06/2023 - vaginal 9. CAUSE: If there are no symptoms, ask: "What do you think is causing the fever?"      "I dont know. I work retirement home, but I haven't been around anyone that's been sick that I know of" 10. CONTACTS: "Does anyone else in the family have an infection?"       None 11. TREATMENT: "What have you done so far to treat this fever?" (e.g., medications)       None "I wasn't sure what I could take." 12. BREASTFEEDING: "Are you breastfeeding?"       Yes  Protocols used: Postpartum - Fever-A-AH

## 2023-09-29 NOTE — Addendum Note (Signed)
 Addended by: Debera Lat on: 09/29/2023 10:06 PM   Modules accepted: Level of Service

## 2023-10-05 ENCOUNTER — Ambulatory Visit: Payer: BC Managed Care – PPO | Admitting: Physician Assistant

## 2023-10-21 ENCOUNTER — Encounter: Payer: Self-pay | Admitting: Physician Assistant

## 2023-10-21 ENCOUNTER — Encounter: Payer: Self-pay | Admitting: Certified Nurse Midwife

## 2023-10-26 ENCOUNTER — Telehealth: Payer: Self-pay

## 2023-10-26 NOTE — Telephone Encounter (Signed)
 Copied from CRM [id number]. Topic: Clinical - Request for Lab/Test Order >> Oct 26, 2023 12:43 PM Higinio Roger wrote: Patient stated Kristi Bastos PA informed her to schedule lab appointment to draw blood for hair loss. No lab order placed to schedule lab appointment.   Callback #: [id number]

## 2023-11-02 ENCOUNTER — Encounter: Payer: Self-pay | Admitting: Podiatry

## 2023-11-02 ENCOUNTER — Ambulatory Visit: Payer: Self-pay | Admitting: Podiatry

## 2023-11-02 DIAGNOSIS — M9901 Segmental and somatic dysfunction of cervical region: Secondary | ICD-10-CM | POA: Diagnosis not present

## 2023-11-02 DIAGNOSIS — L600 Ingrowing nail: Secondary | ICD-10-CM

## 2023-11-02 DIAGNOSIS — M545 Low back pain, unspecified: Secondary | ICD-10-CM | POA: Diagnosis not present

## 2023-11-02 DIAGNOSIS — M62830 Muscle spasm of back: Secondary | ICD-10-CM | POA: Diagnosis not present

## 2023-11-02 DIAGNOSIS — M9903 Segmental and somatic dysfunction of lumbar region: Secondary | ICD-10-CM | POA: Diagnosis not present

## 2023-11-02 MED ORDER — DOXYCYCLINE HYCLATE 100 MG PO TABS: 100.0000 mg | ORAL_TABLET | Freq: Two times a day (BID) | ORAL | 0 refills | Status: DC

## 2023-11-02 MED ORDER — IBUPROFEN 800 MG PO TABS: 800.0000 mg | ORAL_TABLET | Freq: Three times a day (TID) | ORAL | 0 refills | Status: DC | PRN

## 2023-11-02 MED ORDER — MUPIROCIN 2 % EX OINT: 1.0000 | TOPICAL_OINTMENT | Freq: Two times a day (BID) | CUTANEOUS | 1 refills | Status: DC

## 2023-11-02 MED ORDER — TRAMADOL HCL 50 MG PO TABS: 50.0000 mg | ORAL_TABLET | ORAL | 0 refills | Status: AC | PRN

## 2023-11-02 NOTE — Progress Notes (Signed)
 Chief Complaint  Patient presents with   Nail Problem    "I have ingrown toenails on both big toes." N - ingrown toenails L - hallux bilateral D - 2 mos O - gradually worse C - sore, was red around the nail A - shoes, socks, walking T - none    Subjective: Patient presents today for evaluation of pain to the medial and lateral border of the bilateral great toes ongoing for several years intermittently. Patient is concerned for possible ingrown nail.  It is very sensitive to touch.  Patient presents today for further treatment and evaluation.  Past Medical History:  Diagnosis Date   Altered mental status 01/30/2014   Black stool 09/17/2022   Chronic hypertension complicating or reason for care during pregnancy, third trimester 03/19/2023   Started Procardia XL 30 mg 02/25/23  Normal baseline labs  Growth scan in 3rd trimester  Weekly NSTs at 32 weeks     Maternal varicella, non-immune 04/16/2023   Miscarriage    Panic attacks    Pregnancy, location unknown 09/17/2022   Premature rupture of membranes 07/01/2023   RSV (respiratory syncytial virus infection)    6 mo old, admitted at [facility]   Supervision of other normal pregnancy, antepartum 11/26/2022              Clinical Staff    Provider      Office Location     [facility]    Dating     LMP=[redacted]w[redacted]d [place]      Language     English    Anatomy US     WNL      Flu Vaccine     05/16/23    Genetic Screen     NIPS: low risk, XY      TDaP vaccine      04/19/23    Hgb A1C or   GTT    Early :  Third trimester : 108      Covid    No boosters         LAB RESULTS       Rhogam     B/Positive/-- (05/14 1610)      Past Surgical History:  Procedure Laterality Date   WISDOM TOOTH EXTRACTION  10/2021   four;    No Known Allergies  Objective:  General: Well developed, nourished, in no acute distress, alert and oriented x3   Dermatology: Skin is warm, dry and supple bilateral.  Medial and lateral border bilateral great toes is tender with  evidence of an ingrowing nail. Pain on palpation noted to the border of the nail fold. The remaining nails appear unremarkable at this time.   Vascular: DP and PT pulses palpable.  No clinical evidence of vascular compromise  Neruologic: Grossly intact via light touch bilateral.  Musculoskeletal: No pedal deformity noted  Assesement: #1 Paronychia with ingrowing nail medial and lateral border bilateral great toes  Plan of Care:  -Patient evaluated.  -Discussed treatment alternatives and plan of care. Explained nail avulsion procedure and post procedure course to patient. -Patient opted for permanent partial nail avulsion of the ingrown portion of the nail.  -Prior to procedure, local anesthesia infiltration utilized using 3 ml of a 50:50 mixture of 2% plain lidocaine and 0.5% plain marcaine in a normal hallux block fashion and a betadine prep performed.  -Partial permanent nail avulsion with chemical matrixectomy performed using 3x30sec applications of phenol followed by alcohol flush.  -Light dressing applied.  Post care  instructions provided -Prescription for doxycycline 100 mg twice daily x 10 days as well as mupirocin ointment to apply daily -Prescription for Motrin 800 mg TID as well as tramadol 50 mg as needed postprocedural pain -Return to clinic 3 weeks  *works at [employer]  Felecia Shelling, [place] [facility]  Dr. Felecia Shelling, DPM    [address] [place], [place] [address]                Office [phone]  Fax [phone]

## 2023-11-02 NOTE — Patient Instructions (Signed)

## 2023-11-05 DIAGNOSIS — M62830 Muscle spasm of back: Secondary | ICD-10-CM | POA: Diagnosis not present

## 2023-11-05 DIAGNOSIS — M9901 Segmental and somatic dysfunction of cervical region: Secondary | ICD-10-CM | POA: Diagnosis not present

## 2023-11-05 DIAGNOSIS — M9903 Segmental and somatic dysfunction of lumbar region: Secondary | ICD-10-CM | POA: Diagnosis not present

## 2023-11-05 DIAGNOSIS — M545 Low back pain, unspecified: Secondary | ICD-10-CM | POA: Diagnosis not present

## 2023-11-09 DIAGNOSIS — M62830 Muscle spasm of back: Secondary | ICD-10-CM | POA: Diagnosis not present

## 2023-11-09 DIAGNOSIS — M62452 Contracture of muscle, left thigh: Secondary | ICD-10-CM | POA: Diagnosis not present

## 2023-11-09 DIAGNOSIS — M62451 Contracture of muscle, right thigh: Secondary | ICD-10-CM | POA: Diagnosis not present

## 2023-11-09 DIAGNOSIS — M9903 Segmental and somatic dysfunction of lumbar region: Secondary | ICD-10-CM | POA: Diagnosis not present

## 2023-11-09 DIAGNOSIS — M9906 Segmental and somatic dysfunction of lower extremity: Secondary | ICD-10-CM | POA: Diagnosis not present

## 2023-11-09 DIAGNOSIS — M9901 Segmental and somatic dysfunction of cervical region: Secondary | ICD-10-CM | POA: Diagnosis not present

## 2023-11-09 DIAGNOSIS — M545 Low back pain, unspecified: Secondary | ICD-10-CM | POA: Diagnosis not present

## 2023-11-12 DIAGNOSIS — M62451 Contracture of muscle, right thigh: Secondary | ICD-10-CM | POA: Diagnosis not present

## 2023-11-12 DIAGNOSIS — M9906 Segmental and somatic dysfunction of lower extremity: Secondary | ICD-10-CM | POA: Diagnosis not present

## 2023-11-12 DIAGNOSIS — M9903 Segmental and somatic dysfunction of lumbar region: Secondary | ICD-10-CM | POA: Diagnosis not present

## 2023-11-12 DIAGNOSIS — M62452 Contracture of muscle, left thigh: Secondary | ICD-10-CM | POA: Diagnosis not present

## 2023-11-12 DIAGNOSIS — M62830 Muscle spasm of back: Secondary | ICD-10-CM | POA: Diagnosis not present

## 2023-11-12 DIAGNOSIS — M9901 Segmental and somatic dysfunction of cervical region: Secondary | ICD-10-CM | POA: Diagnosis not present

## 2023-11-12 DIAGNOSIS — M545 Low back pain, unspecified: Secondary | ICD-10-CM | POA: Diagnosis not present

## 2023-11-13 DIAGNOSIS — Z419 Encounter for procedure for purposes other than remedying health state, unspecified: Secondary | ICD-10-CM | POA: Diagnosis not present

## 2023-11-16 DIAGNOSIS — M9903 Segmental and somatic dysfunction of lumbar region: Secondary | ICD-10-CM | POA: Diagnosis not present

## 2023-11-16 DIAGNOSIS — M62452 Contracture of muscle, left thigh: Secondary | ICD-10-CM | POA: Diagnosis not present

## 2023-11-16 DIAGNOSIS — M9901 Segmental and somatic dysfunction of cervical region: Secondary | ICD-10-CM | POA: Diagnosis not present

## 2023-11-16 DIAGNOSIS — M62451 Contracture of muscle, right thigh: Secondary | ICD-10-CM | POA: Diagnosis not present

## 2023-11-16 DIAGNOSIS — M62830 Muscle spasm of back: Secondary | ICD-10-CM | POA: Diagnosis not present

## 2023-11-16 DIAGNOSIS — M9906 Segmental and somatic dysfunction of lower extremity: Secondary | ICD-10-CM | POA: Diagnosis not present

## 2023-11-16 DIAGNOSIS — M545 Low back pain, unspecified: Secondary | ICD-10-CM | POA: Diagnosis not present

## 2023-11-19 DIAGNOSIS — M62452 Contracture of muscle, left thigh: Secondary | ICD-10-CM | POA: Diagnosis not present

## 2023-11-19 DIAGNOSIS — M62830 Muscle spasm of back: Secondary | ICD-10-CM | POA: Diagnosis not present

## 2023-11-19 DIAGNOSIS — M9906 Segmental and somatic dysfunction of lower extremity: Secondary | ICD-10-CM | POA: Diagnosis not present

## 2023-11-19 DIAGNOSIS — M9901 Segmental and somatic dysfunction of cervical region: Secondary | ICD-10-CM | POA: Diagnosis not present

## 2023-11-19 DIAGNOSIS — M545 Low back pain, unspecified: Secondary | ICD-10-CM | POA: Diagnosis not present

## 2023-11-19 DIAGNOSIS — M62451 Contracture of muscle, right thigh: Secondary | ICD-10-CM | POA: Diagnosis not present

## 2023-11-19 DIAGNOSIS — M9903 Segmental and somatic dysfunction of lumbar region: Secondary | ICD-10-CM | POA: Diagnosis not present

## 2023-11-23 ENCOUNTER — Encounter: Payer: Self-pay | Admitting: Podiatry

## 2023-11-23 ENCOUNTER — Ambulatory Visit (INDEPENDENT_AMBULATORY_CARE_PROVIDER_SITE_OTHER): Admitting: Podiatry

## 2023-11-23 DIAGNOSIS — M62452 Contracture of muscle, left thigh: Secondary | ICD-10-CM | POA: Diagnosis not present

## 2023-11-23 DIAGNOSIS — L600 Ingrowing nail: Secondary | ICD-10-CM

## 2023-11-23 DIAGNOSIS — M9901 Segmental and somatic dysfunction of cervical region: Secondary | ICD-10-CM | POA: Diagnosis not present

## 2023-11-23 DIAGNOSIS — M9906 Segmental and somatic dysfunction of lower extremity: Secondary | ICD-10-CM | POA: Diagnosis not present

## 2023-11-23 DIAGNOSIS — M62830 Muscle spasm of back: Secondary | ICD-10-CM | POA: Diagnosis not present

## 2023-11-23 DIAGNOSIS — M9903 Segmental and somatic dysfunction of lumbar region: Secondary | ICD-10-CM | POA: Diagnosis not present

## 2023-11-23 DIAGNOSIS — M545 Low back pain, unspecified: Secondary | ICD-10-CM | POA: Diagnosis not present

## 2023-11-23 DIAGNOSIS — M62451 Contracture of muscle, right thigh: Secondary | ICD-10-CM | POA: Diagnosis not present

## 2023-11-25 DIAGNOSIS — M62452 Contracture of muscle, left thigh: Secondary | ICD-10-CM | POA: Diagnosis not present

## 2023-11-25 DIAGNOSIS — M9906 Segmental and somatic dysfunction of lower extremity: Secondary | ICD-10-CM | POA: Diagnosis not present

## 2023-11-25 DIAGNOSIS — M9903 Segmental and somatic dysfunction of lumbar region: Secondary | ICD-10-CM | POA: Diagnosis not present

## 2023-11-25 DIAGNOSIS — M62451 Contracture of muscle, right thigh: Secondary | ICD-10-CM | POA: Diagnosis not present

## 2023-11-25 DIAGNOSIS — M545 Low back pain, unspecified: Secondary | ICD-10-CM | POA: Diagnosis not present

## 2023-11-25 DIAGNOSIS — M62830 Muscle spasm of back: Secondary | ICD-10-CM | POA: Diagnosis not present

## 2023-11-25 DIAGNOSIS — M9901 Segmental and somatic dysfunction of cervical region: Secondary | ICD-10-CM | POA: Diagnosis not present

## 2023-11-29 ENCOUNTER — Ambulatory Visit: Admitting: Physician Assistant

## 2023-11-30 DIAGNOSIS — M62452 Contracture of muscle, left thigh: Secondary | ICD-10-CM | POA: Diagnosis not present

## 2023-11-30 DIAGNOSIS — M9906 Segmental and somatic dysfunction of lower extremity: Secondary | ICD-10-CM | POA: Diagnosis not present

## 2023-11-30 DIAGNOSIS — M545 Low back pain, unspecified: Secondary | ICD-10-CM | POA: Diagnosis not present

## 2023-11-30 DIAGNOSIS — M62830 Muscle spasm of back: Secondary | ICD-10-CM | POA: Diagnosis not present

## 2023-11-30 DIAGNOSIS — M62451 Contracture of muscle, right thigh: Secondary | ICD-10-CM | POA: Diagnosis not present

## 2023-11-30 DIAGNOSIS — M9901 Segmental and somatic dysfunction of cervical region: Secondary | ICD-10-CM | POA: Diagnosis not present

## 2023-11-30 DIAGNOSIS — M9903 Segmental and somatic dysfunction of lumbar region: Secondary | ICD-10-CM | POA: Diagnosis not present

## 2023-11-30 NOTE — Progress Notes (Signed)
   Chief Complaint  Patient presents with   Nail Problem    "It's good."    Subjective: 23 y.o. female presents today status post permanent nail avulsion procedure of the medial and lateral border bilateral great toes that was performed on 11/02/2023.  Patient doing well.  No new complaints.   Past Medical History:  Diagnosis Date   Altered mental status 01/30/2014   Black stool 09/17/2022   Chronic hypertension complicating or reason for care during pregnancy, third trimester 03/19/2023   Started Procardia  XL 30 mg 02/25/23  Normal baseline labs  Growth scan in 3rd trimester  Weekly NSTs at 32 weeks     Maternal varicella, non-immune 04/16/2023   Miscarriage    Panic attacks    Pregnancy, location unknown 09/17/2022   Premature rupture of membranes 07/01/2023   RSV (respiratory syncytial virus infection)    6 mo old, admitted at [facility]   Supervision of other normal pregnancy, antepartum 11/26/2022              Clinical Staff    Provider      Office Location     [facility]    Dating     LMP=[redacted]w[redacted]d US       Language     English    Anatomy US      WNL      Flu Vaccine     05/16/23    Genetic Screen     NIPS: low risk, XY      TDaP vaccine      04/19/23    Hgb A1C or   GTT    Early :  Third trimester : 108      Covid    No boosters         LAB RESULTS       Rhogam     B/Positive/-- (05/14 0859)      Objective: Neurovascular status intact.  Skin is warm, dry and supple. Nail and respective nail fold appears to be healing appropriately.   Assessment: #1 s/p partial permanent nail matrixectomy medial and lateral border bilateral great toes.  11/02/2023   Plan of care: #1 patient was evaluated  #2 light debridement of the periungual debris was performed to the border of the respective toe and nail plate using a tissue nipper. #3 patient is to return to clinic on a PRN basis.   Dot Gazella, DPM [facility]  Dr. Dot Gazella, DPM    [address] [place], [place] [address]                Office [phone]  Fax [phone]

## 2023-12-07 DIAGNOSIS — M62830 Muscle spasm of back: Secondary | ICD-10-CM | POA: Diagnosis not present

## 2023-12-07 DIAGNOSIS — M9906 Segmental and somatic dysfunction of lower extremity: Secondary | ICD-10-CM | POA: Diagnosis not present

## 2023-12-07 DIAGNOSIS — M9901 Segmental and somatic dysfunction of cervical region: Secondary | ICD-10-CM | POA: Diagnosis not present

## 2023-12-07 DIAGNOSIS — M62451 Contracture of muscle, right thigh: Secondary | ICD-10-CM | POA: Diagnosis not present

## 2023-12-07 DIAGNOSIS — M9903 Segmental and somatic dysfunction of lumbar region: Secondary | ICD-10-CM | POA: Diagnosis not present

## 2023-12-07 DIAGNOSIS — M62452 Contracture of muscle, left thigh: Secondary | ICD-10-CM | POA: Diagnosis not present

## 2023-12-07 DIAGNOSIS — M545 Low back pain, unspecified: Secondary | ICD-10-CM | POA: Diagnosis not present

## 2023-12-13 DIAGNOSIS — F331 Major depressive disorder, recurrent, moderate: Secondary | ICD-10-CM | POA: Diagnosis not present

## 2023-12-13 DIAGNOSIS — Z419 Encounter for procedure for purposes other than remedying health state, unspecified: Secondary | ICD-10-CM | POA: Diagnosis not present

## 2023-12-23 ENCOUNTER — Ambulatory Visit: Admission: EM | Admit: 2023-12-23 | Discharge: 2023-12-23 | Disposition: A | Discharge status: Home / Self Care

## 2023-12-23 DIAGNOSIS — M62830 Muscle spasm of back: Secondary | ICD-10-CM | POA: Diagnosis not present

## 2023-12-23 DIAGNOSIS — M62451 Contracture of muscle, right thigh: Secondary | ICD-10-CM | POA: Diagnosis not present

## 2023-12-23 DIAGNOSIS — B349 Viral infection, unspecified: Secondary | ICD-10-CM

## 2023-12-23 DIAGNOSIS — M9901 Segmental and somatic dysfunction of cervical region: Secondary | ICD-10-CM | POA: Diagnosis not present

## 2023-12-23 DIAGNOSIS — M9903 Segmental and somatic dysfunction of lumbar region: Secondary | ICD-10-CM | POA: Diagnosis not present

## 2023-12-23 DIAGNOSIS — M62452 Contracture of muscle, left thigh: Secondary | ICD-10-CM | POA: Diagnosis not present

## 2023-12-23 DIAGNOSIS — J029 Acute pharyngitis, unspecified: Secondary | ICD-10-CM | POA: Diagnosis not present

## 2023-12-23 DIAGNOSIS — M545 Low back pain, unspecified: Secondary | ICD-10-CM | POA: Diagnosis not present

## 2023-12-23 DIAGNOSIS — M9906 Segmental and somatic dysfunction of lower extremity: Secondary | ICD-10-CM | POA: Diagnosis not present

## 2023-12-23 NOTE — Discharge Instructions (Addendum)
 Your strep test is negative.  Take Tylenol  as needed.  Follow-up with your primary care provider if your symptoms are not improving.

## 2023-12-23 NOTE — ED Provider Notes (Signed)
 Arlander Bellman    CSN: [id number] Arrival date & time: 12/23/23  1912      History   Chief Complaint Chief Complaint  Patient presents with   Nasal Congestion   Sore Throat    HPI Kristi Bauer is a 23 y.o. female.  Patient presents with 2-day history of sore throat, congestion, postnasal drip, headache.  No fever, difficulty swallowing, cough, shortness of breath, vomiting, diarrhea.  Negative COVID test at work today.  No OTC medications taken.  Patient is breast-feeding.  The history is provided by the patient and medical records.    Past Medical History:  Diagnosis Date   Altered mental status 01/30/2014   Black stool 09/17/2022   Chronic hypertension complicating or reason for care during pregnancy, third trimester 03/19/2023   Started Procardia  XL 30 mg 02/25/23  Normal baseline labs  Growth scan in 3rd trimester  Weekly NSTs at 32 weeks     Maternal varicella, non-immune 04/16/2023   Miscarriage    Panic attacks    Pregnancy, location unknown 09/17/2022   Premature rupture of membranes 07/01/2023   RSV (respiratory syncytial virus infection)    6 mo old, admitted at [facility]   Supervision of other normal pregnancy, antepartum 11/26/2022              Clinical Staff    Provider      Office Location     [facility]    Dating     LMP=[redacted]w[redacted]d US       Language     English    Anatomy US      WNL      Flu Vaccine     05/16/23    Genetic Screen     NIPS: low risk, XY      TDaP vaccine      04/19/23    Hgb A1C or   GTT    Early :  Third trimester : 108      Covid    No boosters         LAB RESULTS       Rhogam     B/Positive/-- (05/14 0859)      Patient Active Problem List   Diagnosis Date Noted   Breastfeeding problem 09/29/2023   Postpartum care following vaginal delivery 07/01/2023   Headache 05/10/2023   Depression, major, single episode, mild (HCC) 09/17/2022    Past Surgical History:  Procedure Laterality Date   WISDOM TOOTH EXTRACTION  10/2021   four;     OB History     Gravida  3   Para  1   Term  1   Preterm      AB  2   Living  1      SAB  2   IAB      Ectopic      Multiple  0   Live Births  1            Home Medications    Prior to Admission medications   Medication Sig Start Date End Date Taking? Authorizing Provider  Blood Pressure Monitoring (BLOOD PRESSURE KIT) KIT Use as directed 03/19/23   Phylliss Brenner, CNM  doxycycline  (VIBRA -TABS) 100 MG tablet Take 1 tablet (100 mg total) by mouth 2 (two) times daily. Patient not taking: Reported on 12/23/2023 11/02/23   Dot Gazella, DPM  ibuprofen  (ADVIL ) 800 MG tablet Take 1 tablet (800 mg total) by mouth every 8 (eight) hours as needed.  11/02/23   Dot Gazella, DPM  mupirocin  ointment (BACTROBAN ) 2 % Apply 1 Application topically 2 (two) times daily. 11/02/23   Dot Gazella, DPM  Prenatal Vit-Fe Fumarate-FA (PRENATAL PO) Take by mouth.    [provider]  sertraline  (ZOLOFT ) 50 MG tablet TAKE 1 AND 1/2 TABLETS BY MOUTH DAILY 07/15/23   Forestine Igo, CNM    Family History Family History  Problem Relation Age of Onset   Hypertension Mother    Hypertension Father    Healthy Sister    Healthy Brother    Cancer Maternal Grandmother        breast   Heart disease Maternal Grandmother    Hypertension Maternal Grandmother    Heart disease Paternal Grandmother    Hypertension Paternal Grandmother    Cancer Paternal Grandfather        prostate unsure of age; and bone 74   Cancer Maternal Aunt        breast   Heart disease Maternal Aunt    Heart disease Paternal Uncle     Social History Social History   Tobacco Use   Smoking status: Never   Smokeless tobacco: Never  Vaping Use   Vaping status: Never Used  Substance Use Topics   Alcohol use: No   Drug use: No     Allergies   Patient has no known allergies.   Review of Systems Review of Systems  Constitutional:  Negative for chills and fever.  HENT:  Positive for  postnasal drip and sore throat. Negative for ear pain.   Respiratory:  Negative for cough and shortness of breath.   Gastrointestinal:  Negative for diarrhea and vomiting.  Neurological:  Positive for headaches.     Physical Exam Triage Vital Signs ED Triage Vitals  Encounter Vitals Group     BP 12/23/23 1945 117/80     Systolic BP Percentile --      Diastolic BP Percentile --      Pulse Rate 12/23/23 1945 78     Resp 12/23/23 1945 16     Temp 12/23/23 1945 98.7 F (37.1 C)     Temp src --      SpO2 12/23/23 1945 98 %     Weight --      Height --      Head Circumference --      Peak Flow --      Pain Score 12/23/23 1943 4     Pain Loc --      Pain Education --      Exclude from Growth Chart --    No data found.  Updated Vital Signs BP 117/80   Pulse 78   Temp 98.7 F (37.1 C)   Resp 16   SpO2 98%   Breastfeeding Yes   Visual Acuity Right Eye Distance:   Left Eye Distance:   Bilateral Distance:    Right Eye Near:   Left Eye Near:    Bilateral Near:     Physical Exam Constitutional:      General: She is not in acute distress. HENT:     Right Ear: Tympanic membrane normal.     Left Ear: Tympanic membrane normal.     Nose: Nose normal.     Mouth/Throat:     Mouth: Mucous membranes are moist.     Pharynx: Posterior oropharyngeal erythema present.  Cardiovascular:     Rate and Rhythm: Normal rate and regular rhythm.     Heart  sounds: Normal heart sounds.  Pulmonary:     Effort: Pulmonary effort is normal. No respiratory distress.     Breath sounds: Normal breath sounds.  Neurological:     Mental Status: She is alert.      UC Treatments / Results  Labs (all labs ordered are listed, but only abnormal results are displayed) Labs Reviewed - No data to display  EKG   Radiology No results found.  Procedures Procedures (including critical care time)  Medications Ordered in UC Medications - No data to display  Initial Impression / Assessment  and Plan / UC Course  I have reviewed the triage vital signs and the nursing notes.  Pertinent labs & imaging results that were available during my care of the patient were reviewed by me and considered in my medical decision making (see chart for details).    Viral pharyngitis, viral illness, lactating mother.  Rapid strep negative.  Patient had a negative COVID test at work today.  Discussed symptomatic treatment including Tylenol  as needed.  Education provided on sore throat and viral illness.  Instructed patient to follow-up with her PCP if she is not improving.  She agrees to plan of care.  Final Clinical Impressions(s) / UC Diagnoses   Final diagnoses:  Viral pharyngitis  Viral illness  Lactating mother     Discharge Instructions      Your strep test is negative.  Take Tylenol  as needed.  Follow-up with your primary care provider if your symptoms are not improving.    ED Prescriptions   None    PDMP not reviewed this encounter.   Wellington Half, NP 12/23/23 2001

## 2023-12-23 NOTE — ED Triage Notes (Signed)
 Patient to Urgent Care with complaints of nasal congestion/ headaches/ sore throat. Denies any fevers.  Negative covid test at work (today).   Symptoms started on Tuesday. No otc meds.

## 2024-01-13 DIAGNOSIS — Z419 Encounter for procedure for purposes other than remedying health state, unspecified: Secondary | ICD-10-CM | POA: Diagnosis not present

## 2024-02-12 DIAGNOSIS — Z419 Encounter for procedure for purposes other than remedying health state, unspecified: Secondary | ICD-10-CM | POA: Diagnosis not present

## 2024-03-14 DIAGNOSIS — Z419 Encounter for procedure for purposes other than remedying health state, unspecified: Secondary | ICD-10-CM | POA: Diagnosis not present

## 2024-03-23 ENCOUNTER — Other Ambulatory Visit (HOSPITAL_COMMUNITY)
Admission: RE | Admit: 2024-03-23 | Discharge: 2024-03-23 | Disposition: A | Discharge status: Home / Self Care | Source: Ambulatory Visit | Attending: Physician Assistant | Admitting: Physician Assistant

## 2024-03-23 ENCOUNTER — Ambulatory Visit (INDEPENDENT_AMBULATORY_CARE_PROVIDER_SITE_OTHER): Admitting: Physician Assistant

## 2024-03-23 ENCOUNTER — Encounter: Payer: Self-pay | Admitting: Physician Assistant

## 2024-03-23 VITALS — BP 117/65 | HR 85 | Ht 61.5 in | Wt 170.8 lb

## 2024-03-23 DIAGNOSIS — Z113 Encounter for screening for infections with a predominantly sexual mode of transmission: Secondary | ICD-10-CM | POA: Insufficient documentation

## 2024-03-23 DIAGNOSIS — F320 Major depressive disorder, single episode, mild: Secondary | ICD-10-CM | POA: Diagnosis not present

## 2024-03-23 DIAGNOSIS — F419 Anxiety disorder, unspecified: Secondary | ICD-10-CM | POA: Diagnosis not present

## 2024-03-23 LAB — POCT URINALYSIS DIPSTICK
Bilirubin, UA: NEGATIVE
Blood, UA: NEGATIVE
Glucose, UA: NEGATIVE
Ketones, UA: NEGATIVE
Leukocytes, UA: NEGATIVE
Nitrite, UA: NEGATIVE
Protein, UA: POSITIVE — AB
Spec Grav, UA: 1.01 (ref 1.010–1.025)
Urobilinogen, UA: 0.2 U/dL
pH, UA: 7.5 (ref 5.0–8.0)

## 2024-03-23 NOTE — Progress Notes (Signed)
 Established patient visit  Patient: Kristi Bauer   DOB: [birth year]   22 y.o. Female  MRN: [id number] Visit Date: 03/23/2024  Today's healthcare provider: Hildreth Orsak, PA-C   Chief Complaint  Patient presents with   Acute Visit    Patient is present to have STD testing completed as she reports recent exposure but no symptoms    Exposure to STD   Medication Dose Change    Patient would like dose of anxiety medication to be increased   Anxiety    She reports excellent compliance with treatment. She reports excellent tolerance of treatment. She is not having side effects.She feels her anxiety is severe and Worse since last visit. She reports symptoms of fatigue and irritable   Subjective     HPI     Acute Visit    Additional comments: Patient is present to have STD testing completed as she reports recent exposure but no symptoms         Medication Dose Change    Additional comments: Patient would like dose of anxiety medication to be increased        Anxiety    Additional comments: She reports excellent compliance with treatment. She reports excellent tolerance of treatment. She is not having side effects.She feels her anxiety is severe and Worse since last visit. She reports symptoms of fatigue and irritable      Last edited by Lilian Fitzpatrick, CMA on 03/23/2024  9:50 AM.       Discussed the use of AI scribe software for clinical note transcription with the patient, who gave verbal consent to proceed.  History of Present Illness Kristi Bauer is a 22 year old female who presents for medication management and STD testing.  She takes Zoloft  50 mg daily for anxiety and depression. She no longer attends therapy due to insurance limitations but is interested in counseling at a clinic that accepts Medicaid. She describes her anxiety and depression as closely linked.  She seeks STD testing after unprotected intercourse with her child's father, who was  unfaithful during her pregnancy. She is concerned about exposure to HIV, syphilis, gonorrhea, chlamydia, and trichomoniasis. She reports no urinary symptoms or vaginal discharge. She is breastfeeding her eight-month-old and has not had a recent Pap smear. Her partner and his contacts have no noticeable symptoms.       03/23/2024    9:48 AM 09/17/2022    9:37 AM 03/17/2022    2:41 PM  Depression screen PHQ 2/9  Decreased Interest 2 1 1   Down, Depressed, Hopeless 2 1 1   PHQ - 2 Score 4 2 2   Altered sleeping 2 1 3   Tired, decreased energy 3 2 2   Change in appetite 1 2 2   Feeling bad or failure about yourself  2 1 0  Trouble concentrating 1 0 0  Moving slowly or fidgety/restless 1 0 0  Suicidal thoughts 0 0 0  PHQ-9 Score 14 8 9   Difficult doing work/chores Very difficult Somewhat difficult       03/23/2024    9:48 AM 03/17/2022    2:41 PM 01/14/2022    1:40 PM 10/29/2021    9:51 AM  GAD 7 : Generalized Anxiety Score  Nervous, Anxious, on Edge 2 2 3 2   Control/stop worrying 2 2 2 2   Worry too much - different things 2 2 3 2   Trouble relaxing 2 2 3 2   Restless 2 1 2 1   Easily annoyed or irritable 2 2 1  2  Afraid - awful might happen 2 0 1 1  Total GAD 7 Score 14 11 15 12   Anxiety Difficulty Somewhat difficult  Very difficult Somewhat difficult    Medications: Outpatient Medications Prior to Visit  Medication Sig   Blood Pressure Monitoring (BLOOD PRESSURE KIT) KIT Use as directed   ibuprofen  (ADVIL ) 800 MG tablet Take 1 tablet (800 mg total) by mouth every 8 (eight) hours as needed.   mupirocin  ointment (BACTROBAN ) 2 % Apply 1 Application topically 2 (two) times daily.   Prenatal Vit-Fe Fumarate-FA (PRENATAL PO) Take by mouth.   sertraline  (ZOLOFT ) 50 MG tablet TAKE 1 AND 1/2 TABLETS BY MOUTH DAILY   doxycycline  (VIBRA -TABS) 100 MG tablet Take 1 tablet (100 mg total) by mouth 2 (two) times daily. (Patient not taking: Reported on 03/23/2024)   No facility-administered medications  prior to visit.    Review of Systems All negative Except see HPI       Objective    BP 117/65 (BP Location: Left Arm, Patient Position: Sitting, Cuff Size: Normal)   Pulse 85   Ht 5' 1.5 (1.562 m)   Wt 170 lb 12.8 oz (77.5 kg)   LMP 02/21/2024   SpO2 100%   Breastfeeding No   BMI 31.75 kg/m     Physical Exam Constitutional:      General: She is not in acute distress.    Appearance: Normal appearance.  HENT:     Head: Normocephalic.  Pulmonary:     Effort: Pulmonary effort is normal. No respiratory distress.  Neurological:     Mental Status: She is alert and oriented to person, place, and time. Mental status is at baseline.      No results found for any visits on 03/23/24.      Assessment & Plan Anxiety and Depression Chronic Per pt it has been worsening and she requested to increase a dose for zoloft  anxiety more prominent than depression. On Zoloft  50 mg daily for two years. No current therapy due to insurance, but now has access to counseling. - Continue Zoloft  50 mg daily. - Refer to counseling services that accept her insurance. - Reassess medication needs after counseling evaluation. - Schedule follow-up appointment in one month. Will follow-up  Screening for Sexually Transmitted Infections Concerned about STI exposure due to partner's infidelity. Desires comprehensive screening. Breastfeeding, asymptomatic, no known partner STI symptoms. - Order screening for syphilis, HIV, gonorrhea, chlamydia, and trichomoniasis. - Advise use of condoms to prevent STIs in future sexual encounters. - Collect urine sample for STI screening.  Collaboration of Care: Medication Management AEB  , Primary Care Provider AEB  , Psychiatrist AEB  , and Referral or follow-up with counselor/therapist AEB    Patient/Guardian was advised Release of Information must be obtained prior to any record release in order to collaborate their care with an outside provider.  Patient/Guardian was advised if they have not already done so to contact the registration department to sign all necessary forms in order for us  to release information regarding their care.   Consent: Patient/Guardian gives verbal consent for treatment and assignment of benefits for services provided during this visit. Patient/Guardian expressed understanding and agreed to proceed.    No orders of the defined types were placed in this encounter.   No follow-ups on file.   The patient was advised to call back or seek an in-person evaluation if the symptoms worsen or if the condition fails to improve as anticipated.  I discussed the assessment  and treatment plan with the patient. The patient was provided an opportunity to ask questions and all were answered. The patient agreed with the plan and demonstrated an understanding of the instructions.  I, Eliot Popper, PA-C have reviewed all documentation for this visit. The documentation on 03/23/2024  for the exam, diagnosis, procedures, and orders are all accurate and complete.  Jolynn Spencer, [facility], MMS [facility] [phone] (phone) [phone] (fax)  [facility]

## 2024-03-24 ENCOUNTER — Telehealth: Payer: Self-pay

## 2024-03-24 ENCOUNTER — Ambulatory Visit: Payer: Self-pay

## 2024-03-24 LAB — CERVICOVAGINAL ANCILLARY ONLY
Bacterial Vaginitis (gardnerella): NEGATIVE
Candida Glabrata: NEGATIVE
Candida Vaginitis: POSITIVE — AB
Chlamydia: NEGATIVE
Comment: NEGATIVE
Comment: NEGATIVE
Comment: NEGATIVE
Comment: NEGATIVE
Comment: NEGATIVE
Comment: NORMAL
Neisseria Gonorrhea: NEGATIVE
Trichomonas: NEGATIVE

## 2024-03-24 NOTE — Telephone Encounter (Signed)
  Answer Assessment - Initial Assessment Questions 1. REASON FOR CALL or QUESTION: What is your reason for calling today? or How can I best     Patient calling second time regarding yeast infection medication. Is breastfeeding. Advised that since it is after hours, she may not receive a response until Monday. She confirmed that her OBGYN office also closed at 1700 today.  Protocols used: PCP Call - No Triage-A-AH

## 2024-03-24 NOTE — Telephone Encounter (Signed)
 Copied from CRM [id number]. Topic: Clinical - Medication Question >> Mar 24, 2024  3:10 PM Delon DASEN wrote: Reason for CRM: Need prescription for yeast infection, is breastfeeding- [phone]

## 2024-03-27 ENCOUNTER — Ambulatory Visit: Payer: Self-pay | Admitting: Physician Assistant

## 2024-03-27 DIAGNOSIS — B3731 Acute candidiasis of vulva and vagina: Secondary | ICD-10-CM

## 2024-03-27 MED ORDER — FLUCONAZOLE 150 MG PO TABS: ORAL_TABLET | ORAL | 1 refills | Status: DC

## 2024-03-27 NOTE — Telephone Encounter (Signed)
 Patient stated she had already gotten the Diflucan  but had not taken it yet.  Advised not to take while breastfeeding to use vaginal cream

## 2024-04-14 DIAGNOSIS — Z419 Encounter for procedure for purposes other than remedying health state, unspecified: Secondary | ICD-10-CM | POA: Diagnosis not present

## 2024-04-17 ENCOUNTER — Institutional Professional Consult (permissible substitution): Admitting: Licensed Clinical Social Worker

## 2024-04-18 ENCOUNTER — Ambulatory Visit

## 2024-04-18 ENCOUNTER — Other Ambulatory Visit: Payer: Self-pay

## 2024-04-18 VITALS — BP 131/83 | HR 103 | Ht 61.5 in | Wt 176.8 lb

## 2024-04-18 DIAGNOSIS — Z3490 Encounter for supervision of normal pregnancy, unspecified, unspecified trimester: Secondary | ICD-10-CM

## 2024-04-18 DIAGNOSIS — Z3200 Encounter for pregnancy test, result unknown: Secondary | ICD-10-CM

## 2024-04-18 DIAGNOSIS — Z3201 Encounter for pregnancy test, result positive: Secondary | ICD-10-CM

## 2024-04-18 DIAGNOSIS — N912 Amenorrhea, unspecified: Secondary | ICD-10-CM

## 2024-04-18 DIAGNOSIS — O3680X0 Pregnancy with inconclusive fetal viability, not applicable or unspecified: Secondary | ICD-10-CM

## 2024-04-18 LAB — POCT URINE PREGNANCY: Preg Test, Ur: POSITIVE — AB

## 2024-04-18 NOTE — Patient Instructions (Signed)
 Prenatal Classes offered through [facility] [email or web address]  Doula Website [email or web address]   First Trimester of Pregnancy  The first trimester of pregnancy starts on the first day of your last monthly period until the end of week 13. This is months 1 through 3 of pregnancy. A week after a sperm fertilizes an egg, the egg will implant into the wall of the uterus and begin to develop into a baby. Body changes during your first trimester Your body goes through many changes during pregnancy. The changes usually return to normal after your baby is born. Physical changes Your breasts may grow larger and may hurt. The area around your nipples may get darker. Your periods will stop. Your hair and nails may grow faster. You may pee more often. Health changes You may tire easily. Your gums may bleed and may be sensitive when you brush and floss. You may not feel hungry. You may have heartburn. You may throw up or feel like you may throw up. You may want to eat some foods, but not others. You may have headaches. You may have trouble pooping (constipation). Other changes Your emotions may change from day to day. You may have more dreams. Follow these instructions at home: Medicines Talk to your health care provider if you're taking medicines. Ask if the medicines are safe to take during pregnancy. Your provider may change the medicines that you take. Do not take any medicines unless told to by your provider. Take a prenatal vitamin that has at least 600 micrograms (mcg) of folic acid. Do not use herbal medicines, illegal substances, or medicines that are not approved by your provider. Eating and drinking While you're pregnant your body needs extra food for your growing baby. Talk with your provider about what to eat while pregnant. Activity Most women are able  to exercise during pregnancy. Exercises may need to change as your pregnancy goes on. Talk to your provider about your activities and exercise routines. Relieving pain and discomfort Wear a good, supportive bra if your breasts hurt. Rest with your legs raised if you have leg cramps or low back pain. Safety Wear your seatbelt at all times when you're in a car. Talk to your provider if someone hits you, hurts you, or yells at you. Talk with your provider if you're feeling sad or have thoughts of hurting yourself. Lifestyle Certain things can be harmful while you're pregnant. Follow these rules: Do not use hot tubs, steam rooms, or saunas. Do not douche. Do not use tampons or scented pads. Do not drink alcohol,smoke, vape, or use products with nicotine  or tobacco in them. If you need help quitting, talk with your provider. Avoid cat litter boxes and soil used by cats. These things carry germs that can cause harm to your pregnancy and your baby. General instructions Keep all follow-up visits. It helps you and your unborn baby stay as healthy as possible. Write down your questions. Take them to your visits. Your provider will: Talk with you about your overall health. Give you advice or refer you to specialists who can help with different needs, including: Prenatal education classes. Mental health and counseling. Foods and healthy eating. Ask for help if you need help with food. Call your dentist and ask to be seen. Brush your teeth with a soft toothbrush. Floss gently. Where to find more information American Pregnancy Association: americanpregnancy.org [employer]: acog.org Office on [employer] Health: [employer] Contact a health care provider if: You feel dizzy, faint, or  have a fever. You vomit or have watery poop (diarrhea) for 2 days or more. You have abnormal discharge or bleeding from your vagina. You have pain when you pee or your pee smells  bad. You have cramps, pain, or pressure in your belly area. Get help right away if: You have trouble breathing or chest pain. You have any kind of injury, such as from a fall or a car crash. These symptoms may be an emergency. Get help right away. Call 911. Do not wait to see if the symptoms will go away. Do not drive yourself to the hospital. This information is not intended to replace advice given to you by your health care provider. Make sure you discuss any questions you have with your health care provider. Document Revised: 04/22/2023 Document Reviewed: 11/20/2022 Elsevier Patient Education  2024 Elsevier Inc.Commonly Asked Questions During Pregnancy  Cats: A parasite can be excreted in cat feces.  To avoid exposure you need to have another person empty the little box.  If you must empty the litter box you will need to wear gloves.  Wash your hands after handling your cat.  This parasite can also be found in raw or undercooked meat so this should also be avoided.  Colds, Sore Throats, Flu: Please check your medication sheet to see what you can take for symptoms.  If your symptoms are unrelieved by these medications please call the office.  Dental Work: Most any dental work Agricultural consultant recommends is permitted.  X-rays should only be taken during the first trimester if absolutely necessary.  Your abdomen should be shielded with a lead apron during all x-rays.  Please notify your provider prior to receiving any x-rays.  Novocaine is fine; gas is not recommended.  If your dentist requires a note from us  prior to dental work please call the office and we will provide one for you.  Exercise: Exercise is an important part of staying healthy during your pregnancy.  You may continue most exercises you were accustomed to prior to pregnancy.  Later in your pregnancy you will most likely notice you have difficulty with activities requiring balance like riding a bicycle.  It is important that you listen  to your body and avoid activities that put you at a higher risk of falling.  Adequate rest and staying well hydrated are a must!  If you have questions about the safety of specific activities ask your provider.    Exposure to Children with illness: Try to avoid obvious exposure; report any symptoms to us  when noted,  If you have chicken pos, red measles or mumps, you should be immune to these diseases.   Please do not take any vaccines while pregnant unless you have checked with your OB provider.  Fetal Movement: After 28 weeks we recommend you do kick counts twice daily.  Lie or sit down in a calm quiet environment and count your baby movements kicks.  You should feel your baby at least 10 times per hour.  If you have not felt 10 kicks within the first hour get up, walk around and have something sweet to eat or drink then repeat for an additional hour.  If count remains less than 10 per hour notify your provider.  Fumigating: Follow your pest control agent's advice as to how long to stay out of your home.  Ventilate the area well before re-entering.  Hemorrhoids:   Most over-the-counter preparations can be used during pregnancy.  Check your medication to see  what is safe to use.  It is important to use a stool softener or fiber in your diet and to drink lots of liquids.  If hemorrhoids seem to be getting worse please call the office.   Hot Tubs:  Hot tubs Jacuzzis and saunas are not recommended while pregnant.  These increase your internal body temperature and should be avoided.  Intercourse:  Sexual intercourse is safe during pregnancy as long as you are comfortable, unless otherwise advised by your provider.  Spotting may occur after intercourse; report any bright red bleeding that is heavier than spotting.  Labor:  If you know that you are in labor, please go to the hospital.  If you are unsure, please call the office and let us  help you decide what to do.  Lifting, straining, etc:  If your  job requires heavy lifting or straining please check with your provider for any limitations.  Generally, you should not lift items heavier than that you can lift simply with your hands and arms (no back muscles)  Painting:  Paint fumes do not harm your pregnancy, but may make you ill and should be avoided if possible.  Latex or water based paints have less odor than oils.  Use adequate ventilation while painting.  Permanents & Hair Color:  Chemicals in hair dyes are not recommended as they cause increase hair dryness which can increase hair loss during pregnancy.   Highlighting and permanents are allowed.  Dye may be absorbed differently and permanents may not hold as well during pregnancy.  Sunbathing:  Use a sunscreen, as skin burns easily during pregnancy.  Drink plenty of fluids; avoid over heating.  Tanning Beds:  Because their possible side effects are still unknown, tanning beds are not recommended.  Ultrasound Scans:  Routine ultrasounds are performed at approximately 20 weeks.  You will be able to see your baby's general anatomy an if you would like to know the gender this can usually be determined as well.  If it is questionable when you conceived you may also receive an ultrasound early in your pregnancy for dating purposes.  Otherwise ultrasound exams are not routinely performed unless there is a medical necessity.  Although you can request a scan we ask that you pay for it when conducted because insurance does not cover  patient request scans.  Work: If your pregnancy proceeds without complications you may work until your due date, unless your physician or employer advises otherwise.  Round Ligament Pain/Pelvic Discomfort:  Sharp, shooting pains not associated with bleeding are fairly common, usually occurring in the second trimester of pregnancy.  They tend to be worse when standing up or when you remain standing for long periods of time.  These are the result of pressure of certain  pelvic ligaments called round ligaments.  Rest, Tylenol  and heat seem to be the most effective relief.  As the womb and fetus grow, they rise out of the pelvis and the discomfort improves.  Please notify the office if your pain seems different than that described.  It may represent a more serious condition.  Common Medications Safe in Pregnancy  Acne:      Constipation:  Benzoyl Peroxide     Colace  Clindamycin      Dulcolax Suppository  Topica Erythromycin     Fibercon  Salicylic Acid      Metamucil         Miralax AVOID:        Senakot   Accutane  Cough:  Retin-A       Cough Drops  Tetracycline      Phenergan w/ Codeine if Rx  Minocycline      Robitussin (Plain & DM)  Antibiotics:     Crabs/Lice:  Ceclor       RID  Cephalosporins    AVOID:  E-Mycins      Kwell  Keflex  Macrobid/Macrodantin   Diarrhea:  Penicillin      Kao-Pectate  Zithromax      Imodium AD         PUSH FLUIDS AVOID:       Cipro     Fever:  Tetracycline      Tylenol  (Regular or Extra  Minocycline       Strength)  Levaquin      Extra Strength-Do not          Exceed 8 tabs/24 hrs Caffeine:        200mg /day (equiv. To 1 cup of coffee or  approx. 3 12 oz sodas)         Gas: Cold/Hayfever:       Gas-X  Benadryl       Mylicon  Claritin       Phazyme  **Claritin-D        Chlor-Trimeton    Headaches:  Dimetapp      ASA-Free Excedrin  Drixoral-Non-Drowsy     Cold Compress  Mucinex (Guaifenasin)     Tylenol  (Regular or Extra  Sudafed/Sudafed-12 Hour     Strength)  **Sudafed PE Pseudoephedrine   Tylenol  Cold & Sinus     Vicks Vapor Rub  Zyrtec  **AVOID if Problems With Blood Pressure         Heartburn: Avoid lying down for at least 1 hour after meals  Aciphex      Maalox     Rash:  Milk of Magnesia     Benadryl     Mylanta       1% Hydrocortisone Cream  Pepcid   Pepcid  Complete   Sleep Aids:  Prevacid      Ambien    Prilosec       Benadryl   Rolaids       Chamomile Tea  Tums (Limit  4/day)     Unisom          Tylenol  PM         Warm milk-add vanilla or  Hemorrhoids:       Sugar for taste  Anusol/Anusol H.C.  (RX: Analapram 2.5%)  Sugar Substitutes:  Hydrocortisone OTC     Ok in moderation  Preparation H      Tucks        Vaseline lotion applied to tissue with wiping    Herpes:     Throat:  Acyclovir      Oragel  Famvir  Valtrex      Vaccines:         Flu Shot Leg Cramps:       *Gardasil  Benadryl       Hepatitis A         Hepatitis B Nasal Spray:       Pneumovax  Saline Nasal Spray     Polio Booster         Tetanus Nausea:       Tuberculosis test or PPD  Vitamin B6 25 mg TID   AVOID:    Dramamine      *Gardasil  Emetrol       Live Poliovirus  Ginger Root 250 mg  QID    MMR (measles, mumps &  High Complex Carbs @ Bedtime    rebella)  Sea Bands-Accupressure    Varicella (Chickenpox)  Unisom  1/2 tab TID     *No known complications           If received before Pain:         Known pregnancy;   Darvocet       Resume series after  Lortab        Delivery  Percocet    Yeast:   Tramadol      Femstat  Tylenol  3      Gyne-lotrimin  Ultram       Monistat   Vicodin           MISC:         All Sunscreens           Hair Coloring/highlights          Insect Repellant's          (Including DEET)         Mystic Tans

## 2024-04-18 NOTE — Progress Notes (Signed)
    NURSE VISIT NOTE  Subjective:    Patient ID: Kristi Bauer, female    DOB: 20-Jul-2001, 23 y.o.   MRN: [id number]  HPI  Patient is a 23 y.o. [id number] female who presents for evaluation of amenorrhea. She believes she could be pregnant. Pregnancy is not desired. Sexual Activity: single partner, contraception: none. Current symptoms also include: positive home pregnancy test. Last period was abnormal, first period since birth of child in November.    Objective:    BP 131/83   Pulse (!) 103   Ht 5' 1.5 (1.562 m)   Wt 176 lb 12.8 oz (80.2 kg)   LMP 02/21/2024   Breastfeeding No   BMI 32.87 kg/m   Lab Review  Results for orders placed or performed in visit on 04/18/24  POCT urine pregnancy  Result Value Ref Range   Preg Test, Ur Positive (A) Negative    Assessment:   1. Possible pregnancy, not yet confirmed   2. Encounter to determine fetal viability of pregnancy, single or unspecified fetus   3. Current pregnancy with uncertain date of last menstrual period with normal prior pregnancy     Plan:   Pregnancy Test: Positive  Estimated Date of Delivery: 11/27/24. BP Cuff Measurement taken. Cuff Size Adult Encouraged well-balanced diet, plenty of rest when needed, pre-natal vitamins daily and walking for exercise.  Discussed self-help for nausea, avoiding OTC medications until consulting provider or pharmacist, other than Tylenol  as needed, minimal caffeine (1-2 cups daily) and avoiding alcohol.   At this time patient is going to do blood work and follow-up with a [facility] in [place] for next steps.  Feel free to call with any questions.     Waddell JONELLE Maxim, CMA

## 2024-04-19 ENCOUNTER — Other Ambulatory Visit: Payer: Self-pay

## 2024-04-19 ENCOUNTER — Encounter: Payer: Self-pay | Admitting: Certified Nurse Midwife

## 2024-04-19 LAB — BETA HCG QUANT (REF LAB): hCG Quant: 19879 m[IU]/mL

## 2024-04-24 ENCOUNTER — Ambulatory Visit: Admitting: Physician Assistant

## 2024-05-02 ENCOUNTER — Other Ambulatory Visit: Payer: Self-pay | Admitting: Certified Nurse Midwife

## 2024-05-02 ENCOUNTER — Telehealth: Payer: Self-pay

## 2024-05-02 DIAGNOSIS — Z3481 Encounter for supervision of other normal pregnancy, first trimester: Secondary | ICD-10-CM

## 2024-05-02 MED ORDER — DOXYLAMINE-PYRIDOXINE 10-10 MG PO TBEC: DELAYED_RELEASE_TABLET | ORAL | 1 refills | Status: DC

## 2024-05-02 MED ORDER — PRENATAL VITAMIN 27-0.8 MG PO TABS: 1.0000 | ORAL_TABLET | Freq: Every day | ORAL | 10 refills | Status: AC

## 2024-05-02 NOTE — Telephone Encounter (Signed)
 Patient has been advised as below, patient also requesting prescription for prenatal vitamins be sent to her pharmacy as well. KW

## 2024-05-02 NOTE — Telephone Encounter (Signed)
 Patient contacted office stating that she is currently [redacted] weeks pregnant and is still breast feeding, patient reports symptoms of severe nausea and episodes of vomiting and is requesting a prescription for nausea medication sent to pharmacy. Please advise. KW

## 2024-05-02 NOTE — Progress Notes (Signed)
 Reports +HUPT, 7w by LMP. Request for nausea medication. Breastfeeding. Diclegis ordered.

## 2024-05-03 ENCOUNTER — Ambulatory Visit: Admitting: Licensed Practical Nurse

## 2024-05-04 ENCOUNTER — Telehealth: Payer: Self-pay

## 2024-05-04 NOTE — Telephone Encounter (Signed)
 Pt no longer needs prior auth reviewed the b6 and unisom as she was taking medication incorrectly.

## 2024-05-08 ENCOUNTER — Telehealth (INDEPENDENT_AMBULATORY_CARE_PROVIDER_SITE_OTHER)

## 2024-05-08 ENCOUNTER — Telehealth: Payer: Self-pay

## 2024-05-08 DIAGNOSIS — Z3689 Encounter for other specified antenatal screening: Secondary | ICD-10-CM

## 2024-05-08 DIAGNOSIS — Z3480 Encounter for supervision of other normal pregnancy, unspecified trimester: Secondary | ICD-10-CM

## 2024-05-08 NOTE — Patient Instructions (Signed)
 First Trimester of Pregnancy  The first trimester of pregnancy starts on the first day of your last monthly period until the end of week 13. This is months 1 through 3 of pregnancy. A week after a sperm fertilizes an egg, the egg will implant into the wall of the uterus and begin to develop into a baby. Body changes during your first trimester Your body goes through many changes during pregnancy. The changes usually return to normal after your baby is born. Physical changes Your breasts may grow larger and may hurt. The area around your nipples may get darker. Your periods will stop. Your hair and nails may grow faster. You may pee more often. Health changes You may tire easily. Your gums may bleed and may be sensitive when you brush and floss. You may not feel hungry. You may have heartburn. You may throw up or feel like you may throw up. You may want to eat some foods, but not others. You may have headaches. You may have trouble pooping (constipation). Other changes Your emotions may change from day to day. You may have more dreams. Follow these instructions at home: Medicines Talk to your health care provider if you're taking medicines. Ask if the medicines are safe to take during pregnancy. Your provider may change the medicines that you take. Do not take any medicines unless told to by your provider. Take a prenatal vitamin that has at least 600 micrograms (mcg) of folic acid. Do not use herbal medicines, illegal substances, or medicines that are not approved by your provider. Eating and drinking While you're pregnant your body needs extra food for your growing baby. Talk with your provider about what to eat while pregnant. Activity Most women are able to exercise during pregnancy. Exercises may need to change as your pregnancy goes on. Talk to your provider about your activities and exercise routines. Relieving pain and discomfort Wear a good, supportive bra if your breasts  hurt. Rest with your legs raised if you have leg cramps or low back pain. Safety Wear your seatbelt at all times when you're in a car. Talk to your provider if someone hits you, hurts you, or yells at you. Talk with your provider if you're feeling sad or have thoughts of hurting yourself. Lifestyle Certain things can be harmful while you're pregnant. Follow these rules: Do not use hot tubs, steam rooms, or saunas. Do not douche. Do not use tampons or scented pads. Do not drink alcohol,smoke, vape, or use products with nicotine or tobacco in them. If you need help quitting, talk with your provider. Avoid cat litter boxes and soil used by cats. These things carry germs that can cause harm to your pregnancy and your baby. General instructions Keep all follow-up visits. It helps you and your unborn baby stay as healthy as possible. Write down your questions. Take them to your visits. Your provider will: Talk with you about your overall health. Give you advice or refer you to specialists who can help with different needs, including: Prenatal education classes. Mental health and counseling. Foods and healthy eating. Ask for help if you need help with food. Call your dentist and ask to be seen. Brush your teeth with a soft toothbrush. Floss gently. Where to find more information American Pregnancy Association: americanpregnancy.org [employer]: acog.org Office on [employer] Health: [employer] Contact a health care provider if: You feel dizzy, faint, or have a fever. You vomit or have watery poop (diarrhea) for 2  days or more. You have abnormal discharge or bleeding from your vagina. You have pain when you pee or your pee smells bad. You have cramps, pain, or pressure in your belly area. Get help right away if: You have trouble breathing or chest pain. You have any kind of injury, such as from a fall or a car crash. These symptoms may be an  emergency. Get help right away. Call 911. Do not wait to see if the symptoms will go away. Do not drive yourself to the hospital. This information is not intended to replace advice given to you by your health care provider. Make sure you discuss any questions you have with your health care provider. Document Revised: 04/22/2023 Document Reviewed: 11/20/2022 Elsevier Patient Education  2024 Elsevier Inc.Common Medications Safe in Pregnancy  Acne:      Constipation:  Benzoyl Peroxide     Colace  Clindamycin      Dulcolax Suppository  Topica Erythromycin     Fibercon  Salicylic Acid      Metamucil         Miralax AVOID:        Senakot   Accutane    Cough:  Retin-A       Cough Drops  Tetracycline      Phenergan w/ Codeine if Rx  Minocycline      Robitussin (Plain & DM)  Antibiotics:     Crabs/Lice:  Ceclor       RID  Cephalosporins    AVOID:  E-Mycins      Kwell  Keflex  Macrobid/Macrodantin   Diarrhea:  Penicillin      Kao-Pectate  Zithromax      Imodium AD         PUSH FLUIDS AVOID:       Cipro     Fever:  Tetracycline      Tylenol (Regular or Extra  Minocycline       Strength)  Levaquin      Extra Strength-Do not          Exceed 8 tabs/24 hrs Caffeine:        200mg /day (equiv. To 1 cup of coffee or  approx. 3 12 oz sodas)         Gas: Cold/Hayfever:       Gas-X  Benadryl      Mylicon  Claritin       Phazyme  **Claritin-D        Chlor-Trimeton    Headaches:  Dimetapp      ASA-Free Excedrin  Drixoral-Non-Drowsy     Cold Compress  Mucinex (Guaifenasin)     Tylenol (Regular or Extra  Sudafed/Sudafed-12 Hour     Strength)  **Sudafed PE Pseudoephedrine   Tylenol Cold & Sinus     Vicks Vapor Rub  Zyrtec  **AVOID if Problems With Blood Pressure         Heartburn: Avoid lying down for at least 1 hour after meals  Aciphex      Maalox     Rash:  Milk of Magnesia     Benadryl    Mylanta       1% Hydrocortisone Cream  Pepcid  Pepcid Complete   Sleep  Aids:  Prevacid      Ambien   Prilosec       Benadryl  Rolaids       Chamomile Tea  Tums (Limit 4/day)     Unisom         Tylenol PM  Warm milk-add vanilla or  Hemorrhoids:       Sugar for taste  Anusol/Anusol H.C.  (RX: Analapram 2.5%)  Sugar Substitutes:  Hydrocortisone OTC     Ok in moderation  Preparation H      Tucks        Vaseline lotion applied to tissue with wiping    Herpes:     Throat:  Acyclovir      Oragel  Famvir  Valtrex     Vaccines:         Flu Shot Leg Cramps:       *Gardasil  Benadryl      Hepatitis A         Hepatitis B Nasal Spray:       Pneumovax  Saline Nasal Spray     Polio Booster         Tetanus Nausea:       Tuberculosis test or PPD  Vitamin B6 25 mg TID   AVOID:    Dramamine      *Gardasil  Emetrol       Live Poliovirus  Ginger Root 250 mg QID    MMR (measles, mumps &  High Complex Carbs @ Bedtime    rebella)  Sea Bands-Accupressure    Varicella (Chickenpox)  Unisom 1/2 tab TID     *No known complications           If received before Pain:         Known pregnancy;   Darvocet       Resume series after  Lortab        Delivery  Percocet    Yeast:   Tramadol      Femstat  Tylenol 3      Gyne-lotrimin  Ultram       Monistat  Vicodin           MISC:         All Sunscreens           Hair Coloring/highlights          Insect Repellant's          (Including DEET)         Mystic Tans

## 2024-05-08 NOTE — Telephone Encounter (Signed)
 Patient was seen today for NOB intake, she is currently 11wks today, patient has an ultrasound scheduled for 05/22/24 but has not been scheduled for initial prenatal visit. If you would please contact patient and have her scheduled with a midwife. KW

## 2024-05-08 NOTE — Progress Notes (Addendum)
 New OB Intake  I connected with  Kristi Bauer on 05/17/24 at  3:15 PM EDT by telephone Video Visit and verified that I am speaking with the correct person using two identifiers. Nurse is located at [facility] and pt is located at work.  I discussed the limitations, risks, security and privacy concerns of performing an evaluation and management service by telephone and the availability of in person appointments. I also discussed with the patient that there may be a patient responsible charge related to this service. The patient expressed understanding and agreed to proceed.  I explained I am completing New OB Intake today. We discussed her EDD of 11/27/24 that is based on LMP of 02/21/24. Pt is G4/P1. I reviewed her allergies, medications, Medical/Surgical/OB history, and appropriate screenings. There are cats in the home: no. . Based on history, this is a/an pregnancy uncomplicated . Her obstetrical history is significant for none.  Patient Active Problem List   Diagnosis Date Noted   Anxiety 03/23/2024   Breastfeeding problem 09/29/2023   Postpartum care following vaginal delivery 07/01/2023   Headache 05/10/2023   Supervision of other normal pregnancy, antepartum 11/26/2022   Depression, major, single episode, mild 09/17/2022    Concerns addressed today:   Delivery Plans:  Plans to deliver at [facility].  Anatomy US  Explained first scheduled US  will be 05/22/2024. Anatomy US  will be scheduled around [redacted] weeks gestational age.  Labs Discussed genetic screening with patient. Patient will be getting genetic testing to be drawn at new OB visit. Discussed possible labs to be drawn at new OB appointment.  COVID Vaccine Patient has not had COVID vaccine.   Social Determinants of Health Food Insecurity: denies food insecurity WIC Referral: Patient is not interested in referral to [facility], currently on [facility].  Transportation: Patient denies transportation  needs. Childcare: Discussed no children allowed at ultrasound appointments.   First visit review I reviewed new OB appt with pt. I explained she will have blood work and pap smear/pelvic exam if indicated. Explained pt will be seen by Harlene Cisco at first visit; encounter routed to appropriate provider.   Rollo JINNY Maxin, CMA 05/17/2024  8:56 AM

## 2024-05-08 NOTE — Telephone Encounter (Signed)
 Contacted the patient via phone, she is scheduled for Tuesday May 30, 2024 at 2:55.

## 2024-05-14 DIAGNOSIS — Z419 Encounter for procedure for purposes other than remedying health state, unspecified: Secondary | ICD-10-CM | POA: Diagnosis not present

## 2024-05-22 ENCOUNTER — Ambulatory Visit (INDEPENDENT_AMBULATORY_CARE_PROVIDER_SITE_OTHER)

## 2024-05-22 DIAGNOSIS — O3680X0 Pregnancy with inconclusive fetal viability, not applicable or unspecified: Secondary | ICD-10-CM | POA: Diagnosis not present

## 2024-05-22 DIAGNOSIS — Z3A11 11 weeks gestation of pregnancy: Secondary | ICD-10-CM | POA: Diagnosis not present

## 2024-05-25 ENCOUNTER — Encounter: Payer: Self-pay | Admitting: Physician Assistant

## 2024-05-25 ENCOUNTER — Telehealth: Payer: Self-pay | Admitting: Physician Assistant

## 2024-05-25 ENCOUNTER — Ambulatory Visit (INDEPENDENT_AMBULATORY_CARE_PROVIDER_SITE_OTHER): Admitting: Physician Assistant

## 2024-05-25 VITALS — BP 114/75 | HR 103 | Resp 14 | Ht 61.5 in | Wt 170.6 lb

## 2024-05-25 DIAGNOSIS — F419 Anxiety disorder, unspecified: Secondary | ICD-10-CM | POA: Diagnosis not present

## 2024-05-25 DIAGNOSIS — Z139 Encounter for screening, unspecified: Secondary | ICD-10-CM

## 2024-05-25 DIAGNOSIS — Z0001 Encounter for general adult medical examination with abnormal findings: Secondary | ICD-10-CM | POA: Diagnosis not present

## 2024-05-25 DIAGNOSIS — O3680X0 Pregnancy with inconclusive fetal viability, not applicable or unspecified: Secondary | ICD-10-CM

## 2024-05-25 DIAGNOSIS — Z0000 Encounter for general adult medical examination without abnormal findings: Secondary | ICD-10-CM

## 2024-05-25 DIAGNOSIS — E66811 Obesity, class 1: Secondary | ICD-10-CM

## 2024-05-25 DIAGNOSIS — F320 Major depressive disorder, single episode, mild: Secondary | ICD-10-CM

## 2024-05-25 NOTE — Progress Notes (Signed)
 NEW OB HISTORY AND PHYSICAL  SUBJECTIVE:       Kristi Bauer is a 23 y.o. [phone] female, Patient's last menstrual period was 02/21/2024., Estimated Date of Delivery: 11/27/24, [redacted]w[redacted]d, presents today for establishment of Prenatal Care. She reports nausea with vomiting, better with doxylamine & B6 but concerned about weight loss.  Pregnancy unplanned, not involved with father of pregnancy (same father as [name]), he is aware. Mother will be her support in labor. Still nursing [name] at night  Social history Partner/Relationship: not in relationship, FOP aware but not involved Living situation: with son [name] 19m old Work: [employer] Exercise: active caring for son Substance use: denies T/E/D   Van Postnatal Depression Scale - 05/30/24 1458       Edinburgh Postnatal Depression Scale:  In the Past 7 Days   I have been able to laugh and see the funny side of things. 0    I have looked forward with enjoyment to things. 0    I have blamed myself unnecessarily when things went wrong. 1    I have been anxious or worried for no good reason. 1    I have felt scared or panicky for no good reason. 1    Things have been getting on top of me. 1    I have been so unhappy that I have had difficulty sleeping. 1    I have felt sad or miserable. 1    I have been so unhappy that I have been crying. 0    The thought of harming myself has occurred to me. 0    Edinburgh Postnatal Depression Scale Total 6        Depression screen negative  Indications for ASA therapy (per uptodate) One of the following: Previous pregnancy with preeclampsia, especially early onset and with an adverse outcome No Multifetal gestation No Chronic hypertension Yes Type 1 or 2 diabetes mellitus No Chronic kidney disease No Autoimmune disease (antiphospholipid syndrome, systemic lupus erythematosus) No  Two or more of the following: Nulliparity No Obesity (body mass index >30 kg/m2) Yes Family history  of preeclampsia in mother or sister No Age >=35 years No Sociodemographic characteristics (African American race, low socioeconomic level) Yes Personal risk factors (eg, previous pregnancy with low birth weight or small for gestational age infant, previous adverse pregnancy outcome [eg, stillbirth], interval >10 years between pregnancies) No Daily ASA recommended  Indications for early GDM screening  First-degree relative with diabetes No BMI >30kg/m2 Yes Age > 35 No Previous birth of an infant weighing >=4000 g No Gestational diabetes mellitus in a previous pregnancy No Glycated hemoglobin >=5.7 percent (39 mmol/mol), impaired glucose tolerance, or impaired fasting glucose on previous testing No High-risk race/ethnicity (eg, African American, Latino, Native American, Asian American, Pacific Islander) Yes Previous stillbirth of unknown cause No Maternal birthweight > 9 lbs No History of cardiovascular disease No Hypertension or on therapy for hypertension Yes-hx cHTN, no current meds High-density lipoprotein cholesterol level <35 mg/dL (9.09 mmol/L) and/or a triglyceride level >250 mg/dL (7.17 mmol/L) No Polycystic ovary syndrome No Physical inactivity No Other clinical condition associated with insulin resistance (eg, severe obesity, acanthosis nigricans) No Current use of glucocorticoids No   Early screening tests: A1C  Gynecologic History Patient's last menstrual period was 02/21/2024. Normal Contraception: condoms Last Pap: No results found for: DIAGPAP, HPVHIGH, ADEQPAP. Results were: n/a due to age  Obstetric History OB History  Gravida Para Term Preterm AB Living  4 1 1  2  1  SAB IAB Ectopic Multiple Live Births  2   0 1    # Outcome Date GA Lbr Len/2nd Weight Sex Type Anes PTL Lv  4 Current           3 Term 07/01/23 [redacted]w[redacted]d / 02:57 7 lb 7.9 oz (3.4 kg) M Vag-Spont EPI, Local  LIV  2 SAB 06/28/22     SAB     1 SAB 09/29/21     SAB       Past Medical History:   Diagnosis Date   Altered mental status 01/30/2014   Black stool 09/17/2022   Chronic hypertension complicating or reason for care during pregnancy, third trimester 03/19/2023   Started Procardia  XL 30 mg 02/25/23  Normal baseline labs  Growth scan in 3rd trimester  Weekly NSTs at 32 weeks     Depression    Maternal varicella, non-immune 04/16/2023   Miscarriage    Panic attacks    Pregnancy, location unknown 09/17/2022   Premature rupture of membranes 07/01/2023   RSV (respiratory syncytial virus infection)    6 mo old, admitted at [facility]   Supervision of other normal pregnancy, antepartum 11/26/2022              Clinical Staff    Provider      Office Location     [facility]    Dating     LMP=[redacted]w[redacted]d US       Language     English    Anatomy US      WNL      Flu Vaccine     05/16/23    Genetic Screen     NIPS: low risk, XY      TDaP vaccine      04/19/23    Hgb A1C or   GTT    Early :  Third trimester : 108      Covid    No boosters         LAB RESULTS       Rhogam     B/Positive/-- (05/14 0859)      Past Surgical History:  Procedure Laterality Date   WISDOM TOOTH EXTRACTION  10/2021   [age];    Current Outpatient Medications on File Prior to Visit  Medication Sig Dispense Refill   Doxylamine-Pyridoxine (DICLEGIS) 10-10 MG TBEC Take 2 tablets by mouth at bedtime AND 1 tablet daily before breakfast AND 1 tablet daily in the afternoon. Start with 2 tablets at bedtime, if nausea persists after 2-3 days add one tablet daily before breakfast. If nausea persists after another 2-3 days add one tablet each afternoon. 360 tablet 1   Prenatal Vit-Fe Fumarate-FA (PRENATAL VITAMIN) 27-0.8 MG TABS Take 1 tablet by mouth daily. 30 tablet 10   sertraline  (ZOLOFT ) 50 MG tablet TAKE 1 AND 1/2 TABLETS BY MOUTH DAILY 135 tablet 2   No current facility-administered medications on file prior to visit.    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   Occupation: student at [employer]  Tobacco Use   Smoking status: Never   Smokeless tobacco: Never  Vaping Use   Vaping status: Never Used  Substance and Sexual Activity   Alcohol use: No   Drug use:  No   Sexual activity: Not Currently    Partners: Male    Birth control/protection: None  Other Topics Concern   Not on file  Social History Narrative   Not on file   Social Drivers of Health   Financial Resource Strain: Patient Declined (05/08/2024)   Overall Financial Resource Strain (CARDIA)    Difficulty of Paying Living Expenses: Patient declined  Food Insecurity: Patient Declined (05/08/2024)   Hunger Vital Sign    Worried About Running Out of Food in the Last Year: Patient declined    Ran Out of Food in the Last Year: Patient declined  Transportation Needs: Patient Declined (05/08/2024)   PRAPARE - Administrator, Civil Service (Medical): Patient declined    Lack of Transportation (Non-Medical): Patient declined  Physical Activity: Inactive (05/08/2024)   Exercise Vital Sign    Days of Exercise per Week: 0 days    Minutes of Exercise per Session: Not on file  Stress: No Stress Concern Present (05/08/2024)   [employer] of Occupational Health - Occupational Stress Questionnaire    Feeling of Stress: Not at all  Social Connections: Socially Isolated (05/08/2024)   Social Connection and Isolation Panel    Frequency of Communication with Friends and Family: More than three times a week    Frequency of Social Gatherings with Friends and Family: More than three times a week    Attends Religious Services: Patient declined    Database Administrator or Organizations: No    Attends Engineer, Structural: Not on file    Marital Status: Never married  Intimate Partner Violence: Not At Risk  (05/08/2024)   Humiliation, Afraid, Rape, and Kick questionnaire    Fear of Current or Ex-Partner: No    Emotionally Abused: No    Physically Abused: No    Sexually Abused: No    Family History  Problem Relation Age of Onset   Hypertension Mother    Hypertension Father    Healthy Sister    Healthy Brother    Cancer Maternal Grandmother        breast   Heart disease Maternal Grandmother    Hypertension Maternal Grandmother    Heart disease Paternal Grandmother    Hypertension Paternal Grandmother    Cancer Paternal Grandfather        prostate unsure of age; and bone 44   Cancer Maternal Aunt        breast   Heart disease Maternal Aunt    Heart disease Paternal Uncle     The following portions of the patient's history were reviewed and updated as appropriate: allergies, current medications, past OB history, past medical history, past surgical history, past family history, past social history, and problem list.  Constitutional: Denied constitutional symptoms, night sweats, recent illness, fatigue, fever, insomnia and weight loss.  Eyes: Denied eye symptoms, eye pain, photophobia, vision change and visual disturbance.  Ears/Nose/Throat/Neck: Denied ear, nose, throat or neck symptoms, hearing loss, nasal discharge, sinus congestion and sore throat.  Cardiovascular: Denied cardiovascular symptoms, arrhythmia, chest pain/pressure, edema, exercise intolerance, orthopnea and palpitations.  Respiratory: Denied pulmonary symptoms, asthma, pleuritic pain, productive sputum, cough, dyspnea and wheezing.  Gastrointestinal: Denied gastro-esophageal reflux, melena  Genitourinary: Denied genitourinary symptoms including symptomatic vaginal discharge, pelvic relaxation issues, and urinary complaints.  Musculoskeletal: Denied musculoskeletal symptoms, stiffness, swelling, muscle weakness and myalgia.  Dermatologic: Denied dermatology symptoms, rash and scar.  Neurologic: Denied neurology symptoms,  dizziness, headache, neck pain and syncope.  Psychiatric:  Denied psychiatric symptoms, anxiety and depression.  Endocrine: Denied endocrine symptoms including hot flashes and night sweats.     OBJECTIVE: Initial Physical Exam (New OB) BP 117/70   Pulse 99   Wt 169 lb 9.6 oz (76.9 kg)   LMP 02/21/2024   BMI 31.53 kg/m  Physical Exam Vitals reviewed.  Constitutional:      General: She is not in acute distress.    Appearance: Normal appearance.  HENT:     Head: Normocephalic.  Neck:     Thyroid: No thyroid mass or thyromegaly.  Cardiovascular:     Rate and Rhythm: Normal rate and regular rhythm.     Heart sounds: Normal heart sounds.  Pulmonary:     Effort: Pulmonary effort is normal.     Breath sounds: Normal breath sounds.  Chest:  Breasts:    Tanner Score is 5.     Right: No inverted nipple, mass or tenderness.     Left: No inverted nipple, mass or tenderness.  Abdominal:     Palpations: Abdomen is soft.     Tenderness: There is no abdominal tenderness.  Genitourinary:    General: Normal vulva.     Vagina: Normal.     Cervix: Friability present.  Musculoskeletal:     Cervical back: Neck supple. No tenderness.  Skin:    General: Skin is warm and dry.  Neurological:     General: No focal deficit present.     Mental Status: She is alert and oriented to person, place, and time.  Psychiatric:        Mood and Affect: Mood and affect normal.        Behavior: Behavior normal. Behavior is cooperative.     Fetal Heart Rate (bpm): 150  ASSESSMENT: Normal pregnancy   PLAN: Routine prenatal care. We discussed an overview of prenatal care and when to call. Reviewed diet, exercise, and weight gain recommendations in pregnancy. Discussed benefits of breastfeeding and lactation resources at [facility]. I reviewed labs and answered all questions.. Reviewed safe to continue nursing [name] during pregnancy  1. Supervision of other normal pregnancy, antepartum (Primary)  2. [redacted]  weeks gestation of pregnancy  3. Encounter for routine screening for malformation using ultrasonics - US  OB Comp + 14 Wk; Future  4. Antenatal screening encounter - NOB Panel; Future - Culture, OB Urine - Monitor Drug Profile 14(MW) - Nicotine  screen, urine - Urinalysis, Routine w reflex microscopic - Comprehensive metabolic panel - Hemoglobin A1c - Protein / creatinine ratio, urine - TSH + free T4 - Cervicovaginal ancillary only - Cytology - PAP - NOB Panel  5. Screening for diabetes mellitus - Hemoglobin A1c  6. Encounter for screening for maternal depression  7. Genetic screening  8. Screening for human immunodeficiency virus - NOB Panel; Future - NOB Panel  9. Immunity status testing - NOB Panel; Future - NOB Panel  10. Cervical cancer screening - Cytology - PAP  11. Screen for sexually transmitted diseases - NOB Panel; Future - Cervicovaginal ancillary only - NOB Panel  12. Screening for thyroid disorder - TSH + free T4  13. Encounter for supervision of other normal pregnancy in first trimester - PANORAMA PRENATAL TEST   Harlene LITTIE Cisco, CNM

## 2024-05-25 NOTE — Telephone Encounter (Signed)
 Noted, ty

## 2024-05-25 NOTE — Telephone Encounter (Signed)
 Patient dropped form from child care health and safety resource center to be completed. Please call patient once form is completed. Form is in [employer]

## 2024-05-25 NOTE — Progress Notes (Signed)
 Complete physical exam  Patient: Kristi Bauer   DOB: [birth year]   23 y.o. Female  MRN: [id number] Visit Date: 05/25/2024  Today's healthcare provider: Jolynn Spencer, PA-C   Chief Complaint  Patient presents with   Annual Exam    Sleep pattern: 4-6 hours of sleep Exercise: walking at work No concerns   Subjective    Kristi Bauer is a 23 y.o. female who presents today for a complete physical exam.   Discussed the use of AI scribe software for clinical note transcription with the patient, who gave verbal consent to proceed.  History of Present Illness Kristi Bauer is a 23 year old female who presents for a work physical and TB test.  She is currently pregnant and attends monthly OB-GYN appointments. She is breastfeeding her 43-month-old child. She takes Zoloft  daily for anxiety and depression and receives counseling from a pregnancy resource center.  She has no physical conditions affecting her work, such as mobility issues or blood pressure problems. She experiences occasional headaches but no significant issues. She has no cold or heat intolerance, feeling of a lump in her throat, or problems with sugar or cholesterol.  She has received her flu and COVID vaccines. She is scheduled for blood work next Monday, including a TSH test and lipid panel.  She has allergies causing nasal congestion, managed with nasal saline spray. She attends regular dental check-ups every six months. She is unsure about her last eye exam but believes she has 20/20 vision.    Last depression screening scores    05/08/2024    3:18 PM 03/23/2024    9:48 AM 09/17/2022    9:37 AM  PHQ 2/9 Scores  PHQ - 2 Score 0 4 2  PHQ- 9 Score  14 8   Last fall risk screening    03/23/2024   10:31 AM  Fall Risk   Falls in the past year? 0  Number falls in past yr: 0  Injury with Fall? 0  Risk for fall due to : No Fall Risks  Follow up Falls evaluation completed   Last Audit-C  alcohol use screening    05/08/2024    8:23 AM  Alcohol Use Disorder Test (AUDIT)  1. How often do you have a drink containing alcohol? 0  3. How often do you have six or more drinks on one occasion? 0   A score of 3 or more in women, and 4 or more in men indicates increased risk for alcohol abuse, EXCEPT if all of the points are from question 1   Past Medical History:  Diagnosis Date   Altered mental status 01/30/2014   Black stool 09/17/2022   Chronic hypertension complicating or reason for care during pregnancy, third trimester 03/19/2023   Started Procardia  XL 30 mg 02/25/23  Normal baseline labs  Growth scan in 3rd trimester  Weekly NSTs at 32 weeks     Depression    Maternal varicella, non-immune 04/16/2023   Miscarriage    Panic attacks    Pregnancy, location unknown 09/17/2022   Premature rupture of membranes 07/01/2023   RSV (respiratory syncytial virus infection)    6 mo old, admitted at [facility]   Supervision of other normal pregnancy, antepartum 11/26/2022              Clinical Staff    Provider      Office Location     [facility]    Dating  LMP=[redacted]w[redacted]d US       Language     English    Anatomy US      WNL      Flu Vaccine     05/16/23    Genetic Screen     NIPS: low risk, XY      TDaP vaccine      04/19/23    Hgb A1C or   GTT    Early :  Third trimester : 108      Covid    No boosters         LAB RESULTS       Rhogam     B/Positive/-- (05/14 0859)     Past Surgical History:  Procedure Laterality Date   WISDOM TOOTH EXTRACTION  10/2021   four;   Social History   Socioeconomic History   Marital status: Significant Other    Spouse name: Alm   Number of children: 0   Years of education: 13   Highest education level: 12th grade  Occupational History   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   Occupation: Consulting civil engineer at [employer]  Tobacco Use   Smoking status: Never   Smokeless tobacco: Never  Vaping Use   Vaping status: Never Used  Substance and Sexual Activity   Alcohol  use: No   Drug use: No   Sexual activity: Not Currently    Partners: Male    Birth control/protection: None  Other Topics Concern   Not on file  Social History Narrative   Not on file   Social Drivers of Health   Financial Resource Strain: Patient Declined (05/08/2024)   Overall Financial Resource Strain (CARDIA)    Difficulty of Paying Living Expenses: Patient declined  Food Insecurity: Patient Declined (05/08/2024)   Hunger Vital Sign    Worried About Running Out of Food in the Last Year: Patient declined    Ran Out of Food in the Last Year: Patient declined  Transportation Needs: Patient Declined (05/08/2024)   PRAPARE - Administrator, Civil Service (Medical): Patient declined    Lack of Transportation (Non-Medical): Patient declined  Physical Activity: Inactive (05/08/2024)   Exercise Vital Sign    Days of Exercise per Week: 0 days    Minutes of Exercise per Session: Not on file  Stress: No Stress Concern Present (05/08/2024)   [employer] of Occupational Health - Occupational Stress Questionnaire    Feeling of Stress: Not at all  Social Connections: Socially Isolated (05/08/2024)   Social Connection and Isolation Panel    Frequency of Communication with Friends and Family: More than three times a week    Frequency of Social Gatherings with Friends and Family: More than three times a week    Attends Religious Services: Patient declined    Database administrator or Organizations: No    Attends Banker Meetings: Not on file    Marital Status: Never married  Intimate Partner Violence: Not At Risk (05/08/2024)   Humiliation, Afraid, Rape, and Kick questionnaire    Fear of Current or Ex-Partner: No    Emotionally Abused: No    Physically Abused: No    Sexually Abused: No   Family Status  Relation Name Status   Mother Theatre manager   Father  Alive   Sister  Alive   Brother  Alive   MGM Lauraine Alive   MGF doesn't know Deceased   PGM Lauraine Alive    PGF [employer]  Mat Aunt Alan Alive   Pat Uncle [place] Alive  No partnership data on file   Family History  Problem Relation Age of Onset   Hypertension Mother    Hypertension Father    Healthy Sister    Healthy Brother    Cancer Maternal Grandmother        breast   Heart disease Maternal Grandmother    Hypertension Maternal Grandmother    Heart disease Paternal Grandmother    Hypertension Paternal Grandmother    Cancer Paternal Grandfather        prostate unsure of age; and bone 41   Cancer Maternal Aunt        breast   Heart disease Maternal Aunt    Heart disease Paternal Uncle    No Known Allergies  Patient Care Team: [place], Sawsan Riggio, PA-C as PCP - General (Physician Assistant)   Medications: Outpatient Medications Prior to Visit  Medication Sig   Doxylamine-Pyridoxine (DICLEGIS) 10-10 MG TBEC Take 2 tablets by mouth at bedtime AND 1 tablet daily before breakfast AND 1 tablet daily in the afternoon. Start with 2 tablets at bedtime, if nausea persists after 2-3 days add one tablet daily before breakfast. If nausea persists after another 2-3 days add one tablet each afternoon.   Prenatal Vit-Fe Fumarate-FA (PRENATAL VITAMIN) 27-0.8 MG TABS Take 1 tablet by mouth daily.   sertraline  (ZOLOFT ) 50 MG tablet TAKE 1 AND 1/2 TABLETS BY MOUTH DAILY   [DISCONTINUED] fluconazole  (DIFLUCAN ) 150 MG tablet 150 mg orally every 72 hours for 2 doses   [DISCONTINUED] ibuprofen  (ADVIL ) 800 MG tablet Take 1 tablet (800 mg total) by mouth every 8 (eight) hours as needed.   [DISCONTINUED] mupirocin  ointment (BACTROBAN ) 2 % Apply 1 Application topically 2 (two) times daily.   [DISCONTINUED] Blood Pressure Monitoring (BLOOD PRESSURE KIT) KIT Use as directed (Patient not taking: Reported on 05/08/2024)   [DISCONTINUED] doxycycline  (VIBRA -TABS) 100 MG tablet Take 1 tablet (100 mg total) by mouth 2 (two) times daily. (Patient not taking: Reported on 03/23/2024)   [DISCONTINUED] Prenatal Vit-Fe  Fumarate-FA (PRENATAL PO) Take by mouth.   No facility-administered medications prior to visit.    Review of Systems  All other systems reviewed and are negative.  Except see HPI     Objective    BP 114/75   Pulse (!) 103   Resp 14   Ht 5' 1.5 (1.562 m)   Wt 170 lb 9.6 oz (77.4 kg)   LMP 02/21/2024   SpO2 100%   BMI 31.71 kg/m      Physical Exam Vitals reviewed.  Constitutional:      General: She is not in acute distress.    Appearance: Normal appearance. She is well-developed. She is not ill-appearing, toxic-appearing or diaphoretic.  HENT:     Head: Normocephalic and atraumatic.     Right Ear: Tympanic membrane, ear canal and external ear normal.     Left Ear: Tympanic membrane, ear canal and external ear normal.     Nose: Nose normal. No congestion or rhinorrhea.     Mouth/Throat:     Mouth: Mucous membranes are moist.     Pharynx: Oropharynx is clear. No oropharyngeal exudate.  Eyes:     General: No scleral icterus.       Right eye: No discharge.        Left eye: No discharge.     Conjunctiva/sclera: Conjunctivae normal.     Pupils: Pupils are equal, round, and reactive to light.  Neck:  Thyroid: No thyromegaly.     Vascular: No carotid bruit.  Cardiovascular:     Rate and Rhythm: Normal rate and regular rhythm.     Pulses: Normal pulses.     Heart sounds: Normal heart sounds. No murmur heard.    No friction rub. No gallop.  Pulmonary:     Effort: Pulmonary effort is normal. No respiratory distress.     Breath sounds: Normal breath sounds. No wheezing or rales.  Abdominal:     General: Abdomen is flat. Bowel sounds are normal. There is no distension.     Palpations: Abdomen is soft. There is no mass.     Tenderness: There is no abdominal tenderness. There is no right CVA tenderness, left CVA tenderness, guarding or rebound.     Hernia: No hernia is present.  Musculoskeletal:        General: No swelling, tenderness, deformity or signs of injury.  Normal range of motion.     Cervical back: Normal range of motion and neck supple. No rigidity or tenderness.     Right lower leg: No edema.     Left lower leg: No edema.  Lymphadenopathy:     Cervical: No cervical adenopathy.  Skin:    General: Skin is warm and dry.     Coloration: Skin is not jaundiced or pale.     Findings: No bruising, erythema, lesion or rash.  Neurological:     Mental Status: She is alert and oriented to person, place, and time. Mental status is at baseline.     Gait: Gait normal.  Psychiatric:        Mood and Affect: Mood normal.        Behavior: Behavior normal.        Thought Content: Thought content normal.        Judgment: Judgment normal.      No results found for any visits on 05/25/24.  Assessment & Plan    Routine Health Maintenance and Physical Exam  Exercise Activities and Dietary recommendations  Goals   None     Immunization History  Administered Date(s) Administered    sv, Bivalent, Protein Subunit Rsvpref,pf (Abrysvo) 05/19/2023   DTaP 06/21/2002, 08/23/2002, 10/26/2002, 08/13/2003, 03/24/2007   HIB (PRP-OMP) 06/21/2002, 08/23/2002, 10/26/2002, 04/23/2003   HPV Quadrivalent 03/20/2014, 06/22/2014, 10/19/2014   Hepatitis A, Ped/Adol-2 Dose 03/24/2007, 09/16/2011   Hepatitis B, PED/ADOLESCENT 01/15/02, 05/29/2002, 01/26/2003   IPV 06/21/2002, 08/23/2002, 01/26/2003, 03/24/2007   Influenza-Unspecified 07/03/2022, 05/20/2023   MMR 04/23/2003, 03/24/2007   Meningococcal B, OMV 05/22/2019, 06/05/2021   Meningococcal Mcv4o 03/21/2015, 05/22/2019   PFIZER(Purple Top)SARS-COV-2 Vaccination 08/31/2020, 09/21/2020   Pneumococcal Conjugate PCV 7 06/21/2002, 08/23/2002, 04/23/2003, 08/13/2003   Tdap 03/23/2012, 06/05/2021, 04/19/2023   Varicella 04/23/2003, 03/24/2007, 07/02/2023    Health Maintenance  Topic Date Due   Pap Smear  Never done   Flu Shot  03/03/2024   COVID-19 Vaccine (3 - 2025-26 season) 04/03/2024   Chlamydia screening   03/23/2025   DTaP/Tdap/Td vaccine (8 - Td or Tdap) 04/18/2033   Hepatitis B Vaccine  Completed   HPV Vaccine  Completed   Hepatitis C Screening  Completed   HIV Screening  Completed   Meningitis B Vaccine  Completed   Pneumococcal Vaccine  Aged Out    Discussed health benefits of physical activity, and encouraged her to engage in regular exercise appropriate for her age and condition.  Assessment & Plan Adult Wellness Visit Here for a physical examination required for work. Blood pressure  is normal. Has received flu and COVID vaccines. Recent lab work completed, additional lab work planned. - Complete physical examination for work. - Review recent lab work. - Ensure documentation for work is provided. - Add TSH and lipid panel to upcoming lab work.  Supervision of Normal Pregnancy Pregnant and under OB-GYN care with monthly visits. Plans for pelvic floor therapy post-pregnancy to prevent urinary incontinence. - Continue monthly OB-GYN visits. - Ensure communication with OB-GYN regarding any medications. - Plan for pelvic floor therapy post-delivery.  Depression and Anxiety Disorders Depression and anxiety well-controlled with Zoloft  50. Receiving counseling at a pregnancy resource center. Emphasized importance of medication adherence for work capability. - Continue Zoloft  as prescribed Will follow-up  Allergic Rhinitis Exhibits symptoms suggestive of allergic rhinitis, including nasal congestion. - Use nasal saline spray or gel daily to cleanse nasal passages.  Vulvovaginal Candidiasis Under OB-GYN monitoring. - Continue monitoring by OB-GYN.   Screening due TB - QuantiFERON-TB Gold Plus   Obesity (BMI 30.0-34.9) Chronic stable Weight loss of 5% of pt's current weight via healthy diet and daily exercise encouraged. Workup  - Lipid panel - Hemoglobin A1c - Comprehensive metabolic panel with GFR - CBC with Differential/Platelet - TSH - T4, free Will reassess after   receiving lab results Will FU  Annual physical exam (Primary) UTD on dental/eye Things to do to keep yourself healthy  - Exercise at least 30-45 minutes a day, 3-4 days a week.  - Eat a low-fat diet with lots of fruits and vegetables, up to 7-9 servings per day.  - Seatbelts can save your life. Wear them always.  - Smoke detectors on every level of your home, check batteries every year.  - Eye Doctor - have an eye exam every 1-2 years  - Safe sex - if you may be exposed to STDs, use a condom.  - Alcohol -  If you drink, do it moderately, less than 2 drinks per day.  - Health Care Power of Attorney. Choose someone to speak for you if you are not able.  - Depression is common in our stressful world.If you're feeling down or losing interest in things you normally enjoy, please come in for a visit.  - Violence - If anyone is threatening or hurting you, please call immediately.    Return in about 1 year (around 05/25/2025) for CPE.    The patient was advised to call back or seek an in-person evaluation if the symptoms worsen or if the condition fails to improve as anticipated.  I discussed the assessment and treatment plan with the patient. The patient was provided an opportunity to ask questions and all were answered. The patient agreed with the plan and demonstrated an understanding of the instructions.  I, Tayte Childers, PA-C have reviewed all documentation for this visit. The documentation on 05/25/2024  for the exam, diagnosis, procedures, and orders are all accurate and complete.  Jolynn Spencer, [facility], MMS [facility] [phone] (phone) [phone] (fax)  [facility]

## 2024-05-25 NOTE — Patient Instructions (Addendum)
 Second Trimester of Pregnancy  The second trimester of pregnancy is from week 14 through week 27. This is months 4 through 6 of pregnancy. During the second trimester: Morning sickness is less or has stopped. You may have more energy. You may feel hungry more often. At this time, your unborn baby is growing very fast. At the end of the sixth month, the unborn baby may be up to 12 inches long and weigh about 1 pounds. You will likely start to feel the baby move between 16 and 20 weeks of pregnancy. Body changes during your second trimester Your body continues to change during this time. The changes usually go away after your baby is born. Physical changes You will gain more weight. Your belly will get bigger. You may begin to get stretch marks on your hips, belly, and breasts. Your breasts will keep growing and may hurt. You may get dark spots or blotches on your face. A dark line from your belly button to the pubic area may appear. This line is called linea nigra. Your hair may grow faster and get thicker. Health changes You may have headaches. You may have heartburn. You may pee more often. You may have swollen, bulging veins (varicose veins). You may have trouble pooping (constipation), or swollen veins in the butt that can itch or get painful (hemorrhoids). You may have back pain. This is caused by: Weight gain. Pregnancy hormones that are relaxing the joints in your pelvis. Follow these instructions at home: Medicines Talk to your health care provider if you're taking medicines. Ask if the medicines are safe to take during pregnancy. Your provider may change the medicines that you take. Do not take any medicines unless told to by your provider. Take a prenatal vitamin that has at least 600 micrograms (mcg) of folic acid. Do not use herbal medicines, illegal drugs, or medicines that are not approved by your provider. Eating and drinking While you're pregnant your body needs  extra food for your growing baby. Talk with your provider about what to eat while pregnant. Activity Most women are able to exercise during pregnancy. Exercises may need to change as your pregnancy goes on. Talk to your provider about your activities and exercise routines. Relieving pain and discomfort Wear a good, supportive bra if your breasts hurt. Rest with your legs raised if you have leg cramps or low back pain. Take warm sitz baths to soothe pain from hemorrhoids. Use hemorrhoid cream if your provider says it's okay. Do not douche. Do not use tampons or scented pads. Do not use hot tubs, steam rooms, or saunas. Safety Wear your seatbelt at all times when you're in a car. Talk to your provider if someone hits you, hurts you, or yells at you. Talk with your provider if you're feeling sad or have thoughts of hurting yourself. Lifestyle Certain things can be harmful while you're pregnant. It's best to avoid the following: Do not drink alcohol,smoke, vape, or use products with nicotine or tobacco in them. If you need help quitting, talk with your provider. Avoid cat litter boxes and soil used by cats. These things carry germs that can cause harm to your pregnancy and your baby. General instructions Keep all follow-up visits. It helps you and your unborn baby stay as healthy as possible. Write down your questions. Take them to your prenatal visits. Your provider will: Talk with you about your overall health. Give you advice or refer you to specialists who can help with different needs,  including: Prenatal education classes. Mental health and counseling. Foods and healthy eating. Ask for help if you need help with food. Where to find more information American Pregnancy Association: americanpregnancy.org [employer]: acog.org Office on [employer] Health: [place] Contact a health care provider if: You have a headache that does not go away  when you take medicine. You have any of these problems: You can't eat or drink. You throw up or feel like you may throw up. You have watery poop (diarrhea) for 2 days or more. You have pain when you pee or your pee smells bad. You have been sick for 2 days or more and are not getting better. Contact your provider right away if: You have any of these coming from your vagina: Abnormal discharge. Bad-smelling fluid. Bleeding. Your baby is moving less than usual. You have contractions, belly cramping, or have pain in your pelvis or lower back. You have symptoms of high blood pressure or preeclampsia. These include: A severe, throbbing headache that does not go away. Sudden or extreme swelling of your face, hands, legs, or feet. Vision problems: You see spots. You have blurry vision. Your eyes are sensitive to light. If you can't reach the provider, go to an urgent care or emergency room. Get help right away if: You faint, become confused, or can't think clearly. You have chest pain or trouble breathing. You have any kind of injury, such as from a fall or a car crash. These symptoms may be an emergency. Call 911 right away. Do not wait to see if the symptoms will go away. Do not drive yourself to the hospital. This information is not intended to replace advice given to you by your health care provider. Make sure you discuss any questions you have with your health care provider. Document Revised: 04/22/2023 Document Reviewed: 11/20/2022 Elsevier Patient Education  2024 [employer] Pregnancy: Healthy Eating While you're pregnant, your body needs extra nutrition for your growing baby. You also need more vitamins and minerals, such as folic acid, calcium, iron, and vitamin D . Eating a balanced diet is important for both you and your baby. Your need for extra calories will change during pregnancy. During the first 3 months of pregnancy, called the first trimester, you don't need more  calories. During the second trimester, you'll need about 340 extra calories a day. During the third trimester, you'll need about 450 extra calories a day. If you're carrying more than one baby, talk with your health care provider or a dietitian to learn more about your specific eating needs. What are tips for eating healthy during pregnancy? Meal planning  Eating smaller meals throughout the day may help manage some side effects common in pregnancy, like heartburn and reflux. Eat a variety of foods. Be sure to include many types of fruits and vegetables. Two or more servings of fish are recommended each week. Choose fish that are lower in mercury, such as salmon and pollock. Limit foods that have empty calories. These are foods that have little nutritional value, such as sweets, desserts, candies, and drinks with sugar in them. Drinks that have caffeine are OK to drink, but it's better to avoid caffeine. Limit your total caffeine intake to less than 200 mg each day, or the limit you're told by your provider. Be aware that 200 mg of caffeine is 12 oz or 355 mL of coffee, tea, or soda. General information Take a prenatal vitamin to help meet your vitamin and mineral needs during  pregnancy. This includes your need for folic acid, iron, calcium, and vitamin D . Do not try to lose weight or go on a diet during pregnancy. Food safety  Wash your hands before you eat and after you prepare raw meat. Wash all fruits and vegetables well before peeling or eating. Make sure that all meats, poultry, and eggs are cooked to food-safe temperatures or well-done. Taking these actions can help keep your food safe and protect you and your baby from dangerous food illnesses. Ask your provider for more information. What foods should I eat? Fruits All fruits. Eat a variety of colors and types of fruit. Remember to wash your fruits well before peeling or eating. Vegetables All vegetables. Eat a variety of  colors and types of vegetables. Remember to wash your vegetables well before peeling or eating. Grains All grains. Choose whole grains, such as whole-wheat bread, oatmeal, or brown rice. Meats and other protein foods Lean meats, including chicken, turkey, and lean cuts of beef, veal, or pork. Fish that is higher in omega-3 fatty acids and lower in mercury, such as salmon, herring, mussels, trout, sardines, pollock, shrimp, crab, and lobster. Tofu. Tempeh. Beans. Eggs. Peanut butter and other nut butters. Dairy Pasteurized milk and milk alternatives, such as soy milk. Pasteurized yogurt and pasteurized cheese. Cottage cheese. Sour cream. Beverages Water. Juices that contain 100% fruit juice or vegetable juice. Caffeine-free teas and decaffeinated coffee. Fats and oils Fats and oils are OK to include in moderation. Sweets and desserts Sweets and desserts are OK to include in moderation. Seasoning and other foods All pasteurized condiments. The items listed above may not be all the foods and drinks you can have. Talk with a dietitian to learn more. What does 340 extra calories look like? Healthy snacks that give you 340 more calories a day could be: Peanut butter and jelly with milk: 8 oz (237 mL) of low-fat milk. Peanut butter and jelly sandwich made with: 1 slice of whole-wheat bread. 2 teaspoons (10 g) of peanut butter. Yogurt and berries: 1 cup (245 g) of Greek yogurt. 1 cup (150 g) of berries. 2 tablespoons (30 g) of chopped nuts, such as almonds or walnuts. Avocado toast: 1 slice of whole-wheat bread. 1/2 medium avocado (70 g). 1 large egg (50 g). What foods should I avoid? Fruits Raw (unpasteurized) fruit juices. Vegetables Unpasteurized vegetable juices. Meats and other protein foods Precooked or cured meat, such as bologna, hot dogs, sausages, or meat loaves. (If you must eat those meats, reheat them until they are steaming hot.) Refrigerated pate, meat spreads from a meat  counter, or smoked seafood that's found in the refrigerated section of a store. Raw or undercooked meats, poultry, and eggs. Raw fish, such as sushi or sashimi. Fish that have high mercury content, such as tilefish, shark, swordfish, and king mackerel. Dairy Unpasteurized or raw milk and any foods that are made from them. Some of these may be: Homemade yogurts or puddings. Soft cheeses such as: Feta. Queso blanco or fresco. Pharmacist, Hospital or [place]. Blue-veined cheeses. Some of these types of cheeses may be made with pasteurized milk. Check the label. If pasteurized milk is used, they are OK to eat during pregnancy. Deli foods Premade foods from a store or deli, like chicken salad, coleslaw, or egg salad. These are riskier for food illness than fresh or homemade salads. Beverages Alcohol. Sugar-sweetened drinks, such as sodas or teas. Energy drinks. Seasoning and other foods Homemade fermented foods and drinks, such as: Pickles. Sauerkraut. Kombucha.  Store-bought pasteurized versions of these are OK. The items listed above may not be all the foods and drinks you should avoid. Talk with a dietitian to learn more. Where to find more information To learn more, go to: Centers for Disease Control and Prevention at tonerpromos.no. Click Search and type food choices for pregnancy. Find the link you need. MyPlate at http://pittman-dennis.biz/. This information is not intended to replace advice given to you by your health care provider. Make sure you discuss any questions you have with your health care provider. Document Revised: 07/07/2023 Document Reviewed: 07/07/2023 Elsevier Patient Education  2025 [employer]. Exercise During Pregnancy Exercise is an important part of being healthy for people of all ages. Exercise helps your heart and lungs work well. Exercise also: Helps you stay strong and flexible. Helps you keep a healthy body weight. Boosts your energy levels and improves your mood. You  should try to exercise regularly during pregnancy. Exercise routines may need to change later in your pregnancy. In rare cases, certain medical problems in your pregnancy may limit the exercise you can do during pregnancy. Your health care provider will give you information on what exercises will work for you. How does exercise help during pregnancy? Along with staying strong and flexible, exercising during pregnancy can help: Keep strength in muscles that are used during labor and birth. Control weight gain. Speed up your recovery after giving birth. Reduce the need for insulin if you get diabetes during pregnancy. Decrease low back pain. Lower the risk for depression. Lower the risk of cesarean delivery. Treat trouble pooping (constipation). How does exercise affect my baby? Exercise can help you have a healthy pregnancy. Exercise does not cause your baby to be born early. It will not cause your baby to weigh less at birth. What exercises can I do? Many exercises are safe for you to do during pregnancy. Do a variety of exercises that safely increase your heart and breathing rates and help you build and maintain muscle strength. Do exercises as told by your provider. Your provider may recommend: Walking. Swimming. Water aerobics. Riding a stationary bike. Modified yoga or Pilates. Tell your instructor that you're pregnant. Avoid overstretching. Avoid lying on your back for long periods of time. Resistance exercises with weights or elastic bands. Running or jogging. Choose this type of exercise only if: You ran or jogged regularly before your pregnancy. You can run or jog and still talk in full sentences. What exercises should I avoid? You may be told to limit high-intensity exercise depending on your level of fitness and if you exercised regularly before you became pregnant. You can tell that you're exercising at a high intensity if you're breathing much harder and faster and can't hold a  conversation while exercising. You may be told to: Avoid jogging or running, unless you jogged or ran regularly before you became pregnant. Do not run or jog so fast that you're unable to have a conversation. Avoid activities that put you at risk for falling on your belly or getting hit in the belly. Some of these are: Downhill skiing. Rock climbing. Cycling and gymnastics. Horseback riding. Surfing and waterskiing. Contact sports. Avoid scuba diving. Avoid skydiving. Avoid activities that take place in a room that's heated to high temperatures, such as hot yoga or hot Pilates. How do I exercise in a safe way?  Start slowly. Ask your provider to recommend the types of exercise that are safe for you. Avoid overheating. Do not exercise in very high  temperatures or hot rooms. Avoid hot yoga or hot Pilates. Avoid standing still or lying flat on your back as much as you can. Avoid losing too much fluid (dehydration). Drink more fluids as told. Drink before, during, and after you exercise. Avoid overstretching. Because of hormone changes during pregnancy, it's easy to overstretch muscles, tendons, and ligaments. Ligaments are the tissues that connect bones to each other. Do not exercise to lose weight. Do not exercise at more than 6,000 feet above sea level (high elevation) if you don't live at that elevation. Tips and recommendations Wear loose-fitting, breathable clothes. Wear a sports bra to support your breasts. Exercise on most days or all days of the week. Try to exercise for 30 minutes a day, 5 days a week. If problems come up during your pregnancy, you provider may tell you to limit some exercises or to exercise less. If you have concerns, ask your provider. If you actively exercised before your pregnancy, your provider may tell you to continue to do moderate-intensity to high-intensity exercise. If you're just starting to exercise or didn't exercise much before your pregnancy, your  provider may tell you to do low-intensity to moderate-intensity exercise. Questions to ask your health care provider Is exercise safe for me? What are signs that I should stop exercising? Does my health condition mean that I should not exercise during pregnancy? When should I avoid exercising during pregnancy? Stop exercising and contact a health care provider if: You have any unusual symptoms, such as: Mild contractions or cramps in the belly. Dizziness that does not go away when you rest. Headache. Pain and swelling of your calves. Bleeding or fluid leaking from your vagina. Stop exercising and get help right away if: You have: Chest pain. Shortness of breath. Sudden, severe pain in your low back or your belly. Regular, painful contractions before 37 weeks of pregnancy. These symptoms may be an emergency. Call 911 right away. Do not wait to see if the symptoms will go away. Do not drive yourself to the hospital. This information is not intended to replace advice given to you by your health care provider. Make sure you discuss any questions you have with your health care provider. Document Revised: 03/15/2023 Document Reviewed: 03/15/2023 Elsevier Patient Education  2024 [employer]. First Trimester of Pregnancy  The first trimester of pregnancy starts on the first day of your last monthly period until the end of week 13. This is months 1 through 3 of pregnancy. A week after a sperm fertilizes an egg, the egg will implant into the wall of the uterus and begin to develop into a baby. Body changes during your first trimester Your body goes through many changes during pregnancy. The changes usually return to normal after your baby is born. Physical changes Your breasts may grow larger and may hurt. The area around your nipples may get darker. Your periods will stop. Your hair and nails may grow faster. You may pee more often. Health changes You may tire easily. Your gums may  bleed and may be sensitive when you brush and floss. You may not feel hungry. You may have heartburn. You may throw up or feel like you may throw up. You may want to eat some foods, but not others. You may have headaches. You may have trouble pooping (constipation). Other changes Your emotions may change from day to day. You may have more dreams. Follow these instructions at home: Medicines Talk to your health care provider if you're taking medicines.  Ask if the medicines are safe to take during pregnancy. Your provider may change the medicines that you take. Do not take any medicines unless told to by your provider. Take a prenatal vitamin that has at least 600 micrograms (mcg) of folic acid. Do not use herbal medicines, illegal substances, or medicines that are not approved by your provider. Eating and drinking While you're pregnant your body needs extra food for your growing baby. Talk with your provider about what to eat while pregnant. Activity Most women are able to exercise during pregnancy. Exercises may need to change as your pregnancy goes on. Talk to your provider about your activities and exercise routines. Relieving pain and discomfort Wear a good, supportive bra if your breasts hurt. Rest with your legs raised if you have leg cramps or low back pain. Safety Wear your seatbelt at all times when you're in a car. Talk to your provider if someone hits you, hurts you, or yells at you. Talk with your provider if you're feeling sad or have thoughts of hurting yourself. Lifestyle Certain things can be harmful while you're pregnant. Follow these rules: Do not use hot tubs, steam rooms, or saunas. Do not douche. Do not use tampons or scented pads. Do not drink alcohol,smoke, vape, or use products with nicotine or tobacco in them. If you need help quitting, talk with your provider. Avoid cat litter boxes and soil used by cats. These things carry germs that can cause harm to your  pregnancy and your baby. General instructions Keep all follow-up visits. It helps you and your unborn baby stay as healthy as possible. Write down your questions. Take them to your visits. Your provider will: Talk with you about your overall health. Give you advice or refer you to specialists who can help with different needs, including: Prenatal education classes. Mental health and counseling. Foods and healthy eating. Ask for help if you need help with food. Call your dentist and ask to be seen. Brush your teeth with a soft toothbrush. Floss gently. Where to find more information American Pregnancy Association: americanpregnancy.org [employer]: acog.org Office on [employer] Health: [place] Contact a health care provider if: You feel dizzy, faint, or have a fever. You vomit or have watery poop (diarrhea) for 2 days or more. You have abnormal discharge or bleeding from your vagina. You have pain when you pee or your pee smells bad. You have cramps, pain, or pressure in your belly area. Get help right away if: You have trouble breathing or chest pain. You have any kind of injury, such as from a fall or a car crash. These symptoms may be an emergency. Get help right away. Call 911. Do not wait to see if the symptoms will go away. Do not drive yourself to the hospital. This information is not intended to replace advice given to you by your health care provider. Make sure you discuss any questions you have with your health care provider. Document Revised: 04/22/2023 Document Reviewed: 11/20/2022 Elsevier Patient Education  2024 [employer] Inc. Healthy [employer] Gain During Pregnancy Gaining some weight during pregnancy is normal and healthy. The amount of weight you should gain depends on your health and weight before pregnancy. Talk with your health care provider to find out the right amount of weight gain for you. The suggested weight gain is  based on your body mass index, or BMI. Here's a general guide based on your weight before pregnancy: If you're underweight or have a  BMI less than 18.5, you should gain 28-40 lb (13-18 kg). If you're at a normal weight with a BMI of 18.5-24.9, you should gain 25-35 lb (11-16 kg). If you're overweight with a BMI of 25-29.9, you should gain 15-25 lb (7-11 kg). If you're obese with a BMI of 30 or higher, you should gain 11-20 lb (5-9 kg). Your provider may suggest that you try to gain weight slower or gain more weight based on what's best for your baby and your health. What are the risks of unhealthy weight gain for me? Gaining too much weight during pregnancy can lead to: A type of diabetes that can happen during pregnancy. This is called gestational diabetes. Hypertensive disorders of pregnancy. These are problems that cause high blood pressure. Having a difficult delivery or a C-section. What are the risks of unhealthy weight gain for my baby? Gaining too little weight during pregnancy can lead to: Loss of the pregnancy. An early (preterm) birth. Your baby not growing normally. Your baby having a low weight at birth. Gaining too much weight during pregnancy can lead to: Your baby growing larger than normal during pregnancy. Your baby having an increased risk of obesity. What actions can I take to gain a healthy amount of weight during pregnancy? Nutrition  Eat healthy foods. Every day, try to eat: Fruits and vegetables of various colors and kinds. Whole grains, like whole-wheat breads and oatmeal. Low-fat dairy foods such as yogurt, milk, and cheese. Or try non-dairy alternatives from soy or almond. Protein-rich foods, like lean meat, chicken, eggs, and beans. Avoid foods that are fried or have a lot of fat, salt, or sugar. Drink more fluids as told. Choose healthy snacks and drinks: Drink water. Avoid soda, sports drinks, and juices that have added sugar. Avoid drinks with caffeine,  such as coffee and energy drinks. Eat snacks that are high in protein, such as nuts, protein bars, and low-fat yogurt. Carry snacks with you that don't need refrigeration, such as trail mix, an apple, or a bar. If you need help improving your diet, work with your provider or an expert in healthy eating called a dietitian. Activity  Exercise regularly, as told by your provider. If you were active before you became pregnant, you may be able to continue your regular fitness activities. If you were not active before pregnancy, you may slowly build up to exercising for 30 or more minutes on most days of the week. This may include walking, swimming, or yoga. Ask your provider what activities are safe for you. Follow these instructions at home: Take medicines only as told. Take all prenatal supplements as told. Keep track of your weight gain during pregnancy. Keep all prenatal health care visits. These visits are a good time to discuss your weight gain. Your provider will check on your health and the health of your baby. Where to find support If you have questions or need help learning about healthy weight gain during pregnancy, these people may help: Your health care provider. A dietitian. Where to find more information To learn more: Go to bitchilla.com. Click Search and type weight gain. Find the link you need. Contact a health care provider if: You're not able to eat or drink for longer than 24 hours. You can't afford food or have trouble getting regular meals. This information is not intended to replace advice given to you by your health care provider. Make sure you discuss any questions you have with your health care provider. Document Revised: 06/11/2023  Document Reviewed: 06/11/2023 Elsevier Patient Education  2025 [employer]. Genetic Testing During Pregnancy: What to Know Genetic testing is done when you're pregnant to check if your baby might have a congenital  condition. A congenital condition is something a baby is born with, also called a birth condition. These conditions can happen when genes or chromosomes are not normal. Genes are tiny parts in your body that make up chromosomes. Chromosomes are groups of many genes. Together, they tell your body how to look and work. Genes are passed down from parents to their baby. Why is genetic testing done during pregnancy? Genetic testing allows you to: Talk about your test results and future plans with your health care team. Plan for a baby that may be born with a congenital condition. Make plans with your health care team in case your baby needs special care before or after birth. Think about your options regarding whether you want to continue with the pregnancy. Types of genetic tests A genetic test can be a screening test or a diagnostic test. Screening tests     Screening tests are used to check the risk of your baby having a congenital condition. They don't show if your baby actually has the condition. More testing will be needed to know for sure. Screening tests are recommended for all pregnant people. Screening tests will not hurt your baby. Types of screening tests include: Carrier screening. The parents' blood or saliva is tested to check for genes that aren't normal. These genes can be passed to the baby. If both parents have the gene, the baby is at risk. First-trimester screening. This includes a maternal blood test and an ultrasound of your baby. This test checks for a risk of conditions related to chromosomes. It also looks for problems with your baby's heart, belly, or bones. Second-trimester screening. This may include a maternal blood test and an ultrasound of your baby. This test checks for the risk of conditions related to chromosomes. It also looks for problems with many parts of your baby's body. These include the brain, nose, mouth, spine, heart, and arms or legs. Some people may only  have an ultrasound and not have a blood test. Combined or sequential screening. This looks at the results from the blood tests in the first and second trimesters, along with the findings of the first-trimester ultrasound. It helps to tell you more about your baby. This type of testing may be more accurate than just doing screening in the first or second trimester by itself. Cell-free DNA testing. During pregnancy, cells from your placenta get into your blood, which is normal. This test is a blood test that looks at those cells. It's done after 10 weeks of pregnancy. It can be used to check for the risk of conditions caused by having too many chromosomes or an abnormal number of sex chromosomes.  Diagnostic tests Diagnostic tests are done only if your baby is known to be at risk of having a congenital condition. These tests check the cells from your baby to diagnose a condition. Examples of these tests include: Chorionic villus sampling (CVS). This is a procedure where cells are taken from the placenta for testing. To do this, a needle is put into your belly using guided ultrasound. Amniocentesis. This is a procedure where amniotic fluid is removed from the sac around your baby. The cells from the placenta or amniotic fluid are tested for chromosomes that are not normal. What do the results mean? For a screening  test: If your results are negative, it means that your baby is most likely not at a higher risk for a condition. There's still a small chance your baby could have a condition. If your results are positive, it means that your baby's risk for a condition is higher than normal. Your health care provider may want you to have a diagnostic test. For a diagnostic test: If the result is negative, it's not likely that your baby will have a condition. If the test is positive, your baby most likely has a condition. Talk with your provider about what your results mean and what your options are. Questions  to ask your health care provider Talk with your provider about the conditions that run in your family. Ask these questions: Is my baby at risk for a congenital condition? What are the benefits of having genetic screening? Should I meet with a genetic counselor? Should my partner or other members of my family be tested? What tests are best for me and my baby? How much do the tests cost? Will my insurance cover the testing? What are the risks of each test? This information is not intended to replace advice given to you by your health care provider. Make sure you discuss any questions you have with your health care provider. Document Revised: 06/10/2023 Document Reviewed: 06/10/2023 Elsevier Patient Education  2025 [employer]. Pregnancy: Healthy Eating While you're pregnant, your body needs extra nutrition for your growing baby. You also need more vitamins and minerals, such as folic acid, calcium, iron, and vitamin D . Eating a balanced diet is important for both you and your baby. Your need for extra calories will change during pregnancy. During the first 3 months of pregnancy, called the first trimester, you don't need more calories. During the second trimester, you'll need about 340 extra calories a day. During the third trimester, you'll need about 450 extra calories a day. If you're carrying more than one baby, talk with your health care provider or a dietitian to learn more about your specific eating needs. What are tips for eating healthy during pregnancy? Meal planning  Eating smaller meals throughout the day may help manage some side effects common in pregnancy, like heartburn and reflux. Eat a variety of foods. Be sure to include many types of fruits and vegetables. Two or more servings of fish are recommended each week. Choose fish that are lower in mercury, such as salmon and pollock. Limit foods that have empty calories. These are foods that have little nutritional value, such  as sweets, desserts, candies, and drinks with sugar in them. Drinks that have caffeine are OK to drink, but it's better to avoid caffeine. Limit your total caffeine intake to less than 200 mg each day, or the limit you're told by your provider. Be aware that 200 mg of caffeine is 12 oz or 355 mL of coffee, tea, or soda. General information Take a prenatal vitamin to help meet your vitamin and mineral needs during pregnancy. This includes your need for folic acid, iron, calcium, and vitamin D . Do not try to lose weight or go on a diet during pregnancy. Food safety  Wash your hands before you eat and after you prepare raw meat. Wash all fruits and vegetables well before peeling or eating. Make sure that all meats, poultry, and eggs are cooked to food-safe temperatures or well-done. Taking these actions can help keep your food safe and protect you and your baby from dangerous food illnesses. Ask your provider  for more information. What foods should I eat? Fruits All fruits. Eat a variety of colors and types of fruit. Remember to wash your fruits well before peeling or eating. Vegetables All vegetables. Eat a variety of colors and types of vegetables. Remember to wash your vegetables well before peeling or eating. Grains All grains. Choose whole grains, such as whole-wheat bread, oatmeal, or brown rice. Meats and other protein foods Lean meats, including chicken, turkey, and lean cuts of beef, veal, or pork. Fish that is higher in omega-3 fatty acids and lower in mercury, such as salmon, herring, mussels, trout, sardines, pollock, shrimp, crab, and lobster. Tofu. Tempeh. Beans. Eggs. Peanut butter and other nut butters. Dairy Pasteurized milk and milk alternatives, such as soy milk. Pasteurized yogurt and pasteurized cheese. Cottage cheese. Sour cream. Beverages Water. Juices that contain 100% fruit juice or vegetable juice. Caffeine-free teas and decaffeinated coffee. Fats and oils Fats and  oils are OK to include in moderation. Sweets and desserts Sweets and desserts are OK to include in moderation. Seasoning and other foods All pasteurized condiments. The items listed above may not be all the foods and drinks you can have. Talk with a dietitian to learn more. What does 340 extra calories look like? Healthy snacks that give you 340 more calories a day could be: Peanut butter and jelly with milk: 8 oz (237 mL) of low-fat milk. Peanut butter and jelly sandwich made with: 1 slice of whole-wheat bread. 2 teaspoons (10 g) of peanut butter. Yogurt and berries: 1 cup (245 g) of Greek yogurt. 1 cup (150 g) of berries. 2 tablespoons (30 g) of chopped nuts, such as almonds or walnuts. Avocado toast: 1 slice of whole-wheat bread. 1/2 medium avocado (70 g). 1 large egg (50 g). What foods should I avoid? Fruits Raw (unpasteurized) fruit juices. Vegetables Unpasteurized vegetable juices. Meats and other protein foods Precooked or cured meat, such as bologna, hot dogs, sausages, or meat loaves. (If you must eat those meats, reheat them until they are steaming hot.) Refrigerated pate, meat spreads from a meat counter, or smoked seafood that's found in the refrigerated section of a store. Raw or undercooked meats, poultry, and eggs. Raw fish, such as sushi or sashimi. Fish that have high mercury content, such as tilefish, shark, swordfish, and king mackerel. Dairy Unpasteurized or raw milk and any foods that are made from them. Some of these may be: Homemade yogurts or puddings. Soft cheeses such as: Feta. Queso blanco or fresco. Pharmacist, Hospital or [place]. Blue-veined cheeses. Some of these types of cheeses may be made with pasteurized milk. Check the label. If pasteurized milk is used, they are OK to eat during pregnancy. Deli foods Premade foods from a store or deli, like chicken salad, coleslaw, or egg salad. These are riskier for food illness than fresh or homemade  salads. Beverages Alcohol. Sugar-sweetened drinks, such as sodas or teas. Energy drinks. Seasoning and other foods Homemade fermented foods and drinks, such as: Pickles. Sauerkraut. Kombucha. Store-bought pasteurized versions of these are OK. The items listed above may not be all the foods and drinks you should avoid. Talk with a dietitian to learn more. Where to find more information To learn more, go to: Centers for Disease Control and Prevention at tonerpromos.no. Click Search and type food choices for pregnancy. Find the link you need. MyPlate at http://pittman-dennis.biz/. This information is not intended to replace advice given to you by your health care provider. Make sure you discuss any questions you have with  your health care provider. Document Revised: 07/07/2023 Document Reviewed: 07/07/2023 Elsevier Patient Education  2025 [employer]. Second Trimester of Pregnancy  The second trimester of pregnancy is from week 14 through week 27. This is months 4 through 6 of pregnancy. During the second trimester: Morning sickness is less or has stopped. You may have more energy. You may feel hungry more often. At this time, your unborn baby is growing very fast. At the end of the sixth month, the unborn baby may be up to 12 inches long and weigh about 1 pounds. You will likely start to feel the baby move between 16 and 20 weeks of pregnancy. Body changes during your second trimester Your body continues to change during this time. The changes usually go away after your baby is born. Physical changes You will gain more weight. Your belly will get bigger. You may begin to get stretch marks on your hips, belly, and breasts. Your breasts will keep growing and may hurt. You may get dark spots or blotches on your face. A dark line from your belly button to the pubic area may appear. This line is called linea nigra. Your hair may grow faster and get thicker. Health changes You may have  headaches. You may have heartburn. You may pee more often. You may have swollen, bulging veins (varicose veins). You may have trouble pooping (constipation), or swollen veins in the butt that can itch or get painful (hemorrhoids). You may have back pain. This is caused by: Weight gain. Pregnancy hormones that are relaxing the joints in your pelvis. Follow these instructions at home: Medicines Talk to your health care provider if you're taking medicines. Ask if the medicines are safe to take during pregnancy. Your provider may change the medicines that you take. Do not take any medicines unless told to by your provider. Take a prenatal vitamin that has at least 600 micrograms (mcg) of folic acid. Do not use herbal medicines, illegal drugs, or medicines that are not approved by your provider. Eating and drinking While you're pregnant your body needs extra food for your growing baby. Talk with your provider about what to eat while pregnant. Activity Most women are able to exercise during pregnancy. Exercises may need to change as your pregnancy goes on. Talk to your provider about your activities and exercise routines. Relieving pain and discomfort Wear a good, supportive bra if your breasts hurt. Rest with your legs raised if you have leg cramps or low back pain. Take warm sitz baths to soothe pain from hemorrhoids. Use hemorrhoid cream if your provider says it's okay. Do not douche. Do not use tampons or scented pads. Do not use hot tubs, steam rooms, or saunas. Safety Wear your seatbelt at all times when you're in a car. Talk to your provider if someone hits you, hurts you, or yells at you. Talk with your provider if you're feeling sad or have thoughts of hurting yourself. Lifestyle Certain things can be harmful while you're pregnant. It's best to avoid the following: Do not drink alcohol,smoke, vape, or use products with nicotine or tobacco in them. If you need help quitting, talk  with your provider. Avoid cat litter boxes and soil used by cats. These things carry germs that can cause harm to your pregnancy and your baby. General instructions Keep all follow-up visits. It helps you and your unborn baby stay as healthy as possible. Write down your questions. Take them to your prenatal visits. Your provider will: Talk with you  about your overall health. Give you advice or refer you to specialists who can help with different needs, including: Prenatal education classes. Mental health and counseling. Foods and healthy eating. Ask for help if you need help with food. Where to find more information American Pregnancy Association: americanpregnancy.org [employer]: acog.org Office on [employer] Health: travellesson.ca Contact a health care provider if: You have a headache that does not go away when you take medicine. You have any of these problems: You can't eat or drink. You throw up or feel like you may throw up. You have watery poop (diarrhea) for 2 days or more. You have pain when you pee or your pee smells bad. You have been sick for 2 days or more and are not getting better. Contact your provider right away if: You have any of these coming from your vagina: Abnormal discharge. Bad-smelling fluid. Bleeding. Your baby is moving less than usual. You have contractions, belly cramping, or have pain in your pelvis or lower back. You have symptoms of high blood pressure or preeclampsia. These include: A severe, throbbing headache that does not go away. Sudden or extreme swelling of your face, hands, legs, or feet. Vision problems: You see spots. You have blurry vision. Your eyes are sensitive to light. If you can't reach the provider, go to an urgent care or emergency room. Get help right away if: You faint, become confused, or can't think clearly. You have chest pain or trouble breathing. You have any kind of injury, such  as from a fall or a car crash. These symptoms may be an emergency. Call 911 right away. Do not wait to see if the symptoms will go away. Do not drive yourself to the hospital. This information is not intended to replace advice given to you by your health care provider. Make sure you discuss any questions you have with your health care provider. Document Revised: 04/22/2023 Document Reviewed: 11/20/2022 Elsevier Patient Education  2024 [employer] Benefits of Breastfeeding Breastfeeding has many health benefits for babies and their breastfeeding parents. Breast milk is the best form of nutrition for babies. Breastfeeding is recommended until a child is 31 years old or older, as long as this is wanted by both the breastfeeding parent and child. Breastfeeding can be challenging, especially during the first few weeks after your baby is born. Ask your health care provider or breastfeeding specialist (lactation consultant) for more information and help if needed. Asking for help early can provide the support you need for successful breastfeeding. Breastfeeding is not the only way to feed your baby breast milk. Some parents choose not to feed their babies directly from the breast. Instead, they pump and bottle-feed their babies expressed breast milk. How do breastfeeding and pumping benefit me? Breastfeeding and pumping may: Help to create a bond between you and your baby. Slow bleeding after you give birth. Help your uterus return to its size before pregnancy. Help you lose the weight gained during pregnancy. Lower your risk of developing type 2 diabetes, weak bones (osteoporosis), rheumatoid arthritis, cardiovascular disease, and some forms of cancer later in life. How does breast milk benefit my baby? Nutrients in breast milk are better for your baby compared to nutrients in infant formula. Breast milk is easier for your baby to digest. The first milk (colostrum)helps your baby's digestive system  function better. Breast milk lowers the risk of problems with the stomach and intestines. This includes necrotizing enterocolitis (NEC) in newborns. NEC is  the inflammation and death of tissue in the intestine. Babies born early (premature) are at a higher risk of developing NEC. As your baby grows, your body changes the nutrients in your breast milk to meet your baby's needs. Breast milk improves your baby's brain development. Breast milk lowers your baby's risk for sudden infant death syndrome (SIDS). Breast milk can lower your baby's risk of: Ear infections. Infections of the nose, throat, or airways (respiratory infections). Allergies. Obesity. Diabetes. What are breast milk antibodies? Breast milk antibodies are substances that are part of the body's disease-fighting system (immune system). Antibodies help your baby fight off infections. Breast milk antibodies protect your baby from illnesses that you have: Had yourself. Been vaccinated against. Been exposed to while breastfeeding or pumping. What are other benefits of breastfeeding and bottle-feeding expressed breast milk? Breast milk is free. Breastfeeding may be convenient. In an emergency situation, formula shortage, or natural disaster, you will be able to feed your baby without the need for safe water or infant formula. If you are exclusively pumping, manual breast pumps are available to express breast milk. You will need to boil pump parts to keep them clean between uses. If you do not have access to clean water or a manual breast pump, you can hand express your milk. Do this by compressing and massaging the milk ducts of each breast and catching the breast milk in a bottle or storage container. Where to find more information [employer]: llli.org Breastfeeding, Centers for Disease Control and Prevention (CDC): tonerpromos.no Breastfeeding and Returning to the Workplace, Marine Scientist for Disease Control and Prevention  (CDC): tonerpromos.no [employer]ologists (ACOG) : acog.org WIC Breastfeeding Support: wicbreastfeeding.fns.usda.gov Contact a health care provider if: You are frustrated with breastfeeding. You are worried your baby is not getting enough milk. Signs of not getting enough milk include: Your baby is not gaining weight or loses weight. Your baby is more than 14 week old and wetting fewer than 6 diapers in a 24-hour period. You do not hear your baby swallowing during feeds. Your full breasts do not get softer after your baby breastfeeds. Your baby is frequently too sleepy to feed well or is crying and will not stop. You have pain or discomfort in your breasts such as: Continued pain with breastfeeding. Breast engorgement that does not improve after 48-72 hours. Cracking or soreness in your nipples that does not get better with treatment. Bleeding from your nipples. This information is not intended to replace advice given to you by your health care provider. Make sure you discuss any questions you have with your health care provider. Document Revised: 08/06/2022 Document Reviewed: 07/10/2022 Elsevier Patient Education  2024 [employer]. Problems to Watch for During Pregnancy During pregnancy, your body goes through many changes. Some changes may be uncomfortable. But most changes are not a serious problem. It's important to learn when certain signs and symptoms may be a problem. Talk with your health care provider about any medical conditions you have. Make sure you know the symptoms to watch for. Reporting problems early will prevent complications. Problems to watch for during pregnancy You're more likely to get an infection during pregnancy. Let your provider know if you have signs of infection, such as: A fever. A bad-smelling fluid from your vagina. Peeing too often, wanting to pee urgently, or pain when you pee. Also, let your provider know if: You're very  tired, you feel dizzy, or you faint. You have watery poop (diarrhea)  for 24 hours or longer. You throw up or feel like throwing up for 24 hours or longer. You have cramping in your belly or have pain in your hips or lower back. You have spotting, bleeding, or leaking of fluid from your vagina. You have pain, swelling, or redness in an arm or leg. You should also watch for signs of high blood pressure and preeclampsia. These signs can be very serious. They include: A headache that doesn't go away when you take medicine. Sudden or very bad swelling of your face, hands, legs, or feet. Problems seeing, such as: You see spots. You have blurry vision. You may be sensitive to light. Why it's important to watch for these problems Watching and reporting problems to your provider can help prevent complications that may affect you and your baby. These include: Higher risk of giving birth early. Infection that may be passed on to your baby. Higher risk for stillbirth. Follow these instructions at home:  Take your medicines only as told. Keep all follow-up visits. Your provider needs to monitor your health and your baby's health. Where to find more information To learn more, go to these websites: Centers for Disease Control and Prevention (CDC) at tonerpromos.no. Then: Click Health Topics A-Z. Type urgent maternal warning signs in the search box. [employer]necologists (ACOG): acog.org Contact a health care provider if: You have any problems while you're pregnant. You feel your baby moving less than usual. You have any of these things: You have strong emotions, such as sadness or anxiety, that affect your daily life. You do not feel safe in your home. You use tobacco, alcohol, or drugs, and you need help to stop. Get help right away if: You faint, have a seizure, or cannot think clearly. You have chest pain or difficulty breathing. You have any of the following  symptoms and you were unable to reach your provider: You have symptoms of infection, including a fever, or have vaginal bleeding. You have symptoms of high blood pressure or preeclampsia. You have signs or symptoms of labor before 37 weeks of pregnancy. These include: Contractions that are 5 minutes or less apart, or that increase in frequency, intensity, or length. Sudden, sharp pain in the belly, or low back pain. Any amount of fluid that flows from your vagina without stopping. These symptoms may be an emergency. Call 911 right away. Do not wait to see if the symptoms will go away. Do not drive yourself to the hospital. This information is not intended to replace advice given to you by your health care provider. Make sure you discuss any questions you have with your health care provider. Document Revised: 12/29/2022 Document Reviewed: 12/29/2022 Elsevier Patient Education  2024 [employer].

## 2024-05-29 ENCOUNTER — Ambulatory Visit: Payer: Self-pay | Admitting: Physician Assistant

## 2024-05-29 LAB — QUANTIFERON-TB GOLD PLUS
QuantiFERON Mitogen Value: >10 [IU]/mL
QuantiFERON Nil Value: 0.01 [IU]/mL
QuantiFERON TB1 Ag Value: 0.02 [IU]/mL
QuantiFERON TB2 Ag Value: 0.02 [IU]/mL
QuantiFERON-TB Gold Plus: NEGATIVE

## 2024-05-29 LAB — CBC WITH DIFFERENTIAL/PLATELET
Basophils Absolute: 0.1 x10E3/uL (ref 0.0–0.2)
Basos: 1 %
EOS (ABSOLUTE): 0.1 x10E3/uL (ref 0.0–0.4)
Eos: 1 %
Hematocrit: 43.5 % (ref 34.0–46.6)
Hemoglobin: 14.1 g/dL (ref 11.1–15.9)
Immature Grans (Abs): 0 x10E3/uL (ref 0.0–0.1)
Immature Granulocytes: 0 %
Lymphocytes Absolute: 2.3 x10E3/uL (ref 0.7–3.1)
Lymphs: 22 %
MCH: 28.5 pg (ref 26.6–33.0)
MCHC: 32.4 g/dL (ref 31.5–35.7)
MCV: 88 fL (ref 79–97)
Monocytes Absolute: 0.7 x10E3/uL (ref 0.1–0.9)
Monocytes: 7 %
Neutrophils Absolute: 7.1 x10E3/uL — ABNORMAL HIGH (ref 1.4–7.0)
Neutrophils: 69 %
Platelets: 248 x10E3/uL (ref 150–450)
RBC: 4.95 x10E6/uL (ref 3.77–5.28)
RDW: 13 % (ref 11.7–15.4)
WBC: 10.3 x10E3/uL (ref 3.4–10.8)

## 2024-05-29 LAB — COMPREHENSIVE METABOLIC PANEL WITH GFR
ALT: 7 IU/L (ref 0–32)
AST: 14 IU/L (ref 0–40)
Albumin: 4.3 g/dL (ref 4.0–5.0)
Alkaline Phosphatase: 69 IU/L (ref 41–116)
BUN/Creatinine Ratio: 13 (ref 9–23)
BUN: 8 mg/dL (ref 6–20)
Bilirubin Total: 0.3 mg/dL (ref 0.0–1.2)
CO2: 22 mmol/L (ref 20–29)
Calcium: 10.4 mg/dL — ABNORMAL HIGH (ref 8.7–10.2)
Chloride: 101 mmol/L (ref 96–106)
Creatinine, Ser: 0.63 mg/dL (ref 0.57–1.00)
Globulin, Total: 2.6 g/dL (ref 1.5–4.5)
Glucose: 77 mg/dL (ref 70–99)
Potassium: 4.6 mmol/L (ref 3.5–5.2)
Sodium: 137 mmol/L (ref 134–144)
Total Protein: 6.9 g/dL (ref 6.0–8.5)
eGFR: 129 mL/min/1.73 (ref >59)

## 2024-05-29 LAB — HEMOGLOBIN A1C
Est. average glucose Bld gHb Est-mCnc: 97 mg/dL
Hgb A1c MFr Bld: 5 % (ref 4.8–5.6)

## 2024-05-29 LAB — LIPID PANEL
Chol/HDL Ratio: 1.8 ratio (ref 0.0–4.4)
Cholesterol, Total: 153 mg/dL (ref 100–199)
HDL: 84 mg/dL (ref >39)
LDL Chol Calc (NIH): 59 mg/dL (ref 0–99)
Triglycerides: 46 mg/dL (ref 0–149)
VLDL Cholesterol Cal: 10 mg/dL (ref 5–40)

## 2024-05-29 LAB — T4, FREE: Free T4: 1.26 ng/dL (ref 0.82–1.77)

## 2024-05-29 LAB — TSH: TSH: 0.467 u[IU]/mL (ref 0.450–4.500)

## 2024-05-30 ENCOUNTER — Other Ambulatory Visit (HOSPITAL_COMMUNITY)
Admission: RE | Admit: 2024-05-30 | Discharge: 2024-05-30 | Disposition: A | Discharge status: Home / Self Care | Source: Ambulatory Visit | Attending: Certified Nurse Midwife | Admitting: Certified Nurse Midwife

## 2024-05-30 ENCOUNTER — Ambulatory Visit (INDEPENDENT_AMBULATORY_CARE_PROVIDER_SITE_OTHER): Admitting: Certified Nurse Midwife

## 2024-05-30 VITALS — BP 117/70 | HR 99 | Wt 169.6 lb

## 2024-05-30 DIAGNOSIS — Z369 Encounter for antenatal screening, unspecified: Secondary | ICD-10-CM | POA: Diagnosis not present

## 2024-05-30 DIAGNOSIS — Z114 Encounter for screening for human immunodeficiency virus [HIV]: Secondary | ICD-10-CM

## 2024-05-30 DIAGNOSIS — Z113 Encounter for screening for infections with a predominantly sexual mode of transmission: Secondary | ICD-10-CM | POA: Insufficient documentation

## 2024-05-30 DIAGNOSIS — Z1332 Encounter for screening for maternal depression: Secondary | ICD-10-CM

## 2024-05-30 DIAGNOSIS — Z3481 Encounter for supervision of other normal pregnancy, first trimester: Secondary | ICD-10-CM | POA: Diagnosis not present

## 2024-05-30 DIAGNOSIS — Z131 Encounter for screening for diabetes mellitus: Secondary | ICD-10-CM

## 2024-05-30 DIAGNOSIS — Z363 Encounter for antenatal screening for malformations: Secondary | ICD-10-CM

## 2024-05-30 DIAGNOSIS — Z3A12 12 weeks gestation of pregnancy: Secondary | ICD-10-CM

## 2024-05-30 DIAGNOSIS — Z0184 Encounter for antibody response examination: Secondary | ICD-10-CM

## 2024-05-30 DIAGNOSIS — Z124 Encounter for screening for malignant neoplasm of cervix: Secondary | ICD-10-CM | POA: Diagnosis present

## 2024-05-30 DIAGNOSIS — Z1329 Encounter for screening for other suspected endocrine disorder: Secondary | ICD-10-CM | POA: Diagnosis not present

## 2024-05-30 DIAGNOSIS — Z3480 Encounter for supervision of other normal pregnancy, unspecified trimester: Secondary | ICD-10-CM

## 2024-05-30 DIAGNOSIS — Z1379 Encounter for other screening for genetic and chromosomal anomalies: Secondary | ICD-10-CM

## 2024-05-30 MED ORDER — ONDANSETRON 4 MG PO TBDP: 4.0000 mg | ORAL_TABLET | Freq: Three times a day (TID) | ORAL | 1 refills | Status: AC | PRN

## 2024-05-30 MED ORDER — ASPIRIN 81 MG PO TBEC: 81.0000 mg | DELAYED_RELEASE_TABLET | Freq: Every day | ORAL | 2 refills | Status: AC

## 2024-05-30 MED ORDER — VITAFUSION PRENATAL 0.18-32.5 MG PO CHEW: 2.0000 | CHEWABLE_TABLET | Freq: Every day | ORAL | 11 refills | Status: AC

## 2024-05-31 ENCOUNTER — Telehealth: Payer: Self-pay

## 2024-05-31 LAB — URINALYSIS, ROUTINE W REFLEX MICROSCOPIC
Bilirubin, UA: NEGATIVE
Glucose, UA: NEGATIVE
Ketones, UA: NEGATIVE
Nitrite, UA: NEGATIVE
Protein,UA: NEGATIVE
RBC, UA: NEGATIVE
Specific Gravity, UA: 1.02 (ref 1.005–1.030)
Urobilinogen, Ur: 0.2 mg/dL (ref 0.2–1.0)
pH, UA: 6 (ref 5.0–7.5)

## 2024-05-31 LAB — COMPREHENSIVE METABOLIC PANEL WITH GFR
ALT: 8 IU/L (ref 0–32)
AST: 11 IU/L (ref 0–40)
Albumin: 4.5 g/dL (ref 4.0–5.0)
Alkaline Phosphatase: 73 IU/L (ref 41–116)
BUN/Creatinine Ratio: 18 (ref 9–23)
BUN: 12 mg/dL (ref 6–20)
Bilirubin Total: 0.2 mg/dL (ref 0.0–1.2)
CO2: 19 mmol/L — ABNORMAL LOW (ref 20–29)
Calcium: 10.5 mg/dL — ABNORMAL HIGH (ref 8.7–10.2)
Chloride: 100 mmol/L (ref 96–106)
Creatinine, Ser: 0.67 mg/dL (ref 0.57–1.00)
Globulin, Total: 2.8 g/dL (ref 1.5–4.5)
Glucose: 76 mg/dL (ref 70–99)
Potassium: 3.9 mmol/L (ref 3.5–5.2)
Sodium: 135 mmol/L (ref 134–144)
Total Protein: 7.3 g/dL (ref 6.0–8.5)
eGFR: 127 mL/min/1.73 (ref >59)

## 2024-05-31 LAB — TSH+FREE T4
Free T4: 1.25 ng/dL (ref 0.82–1.77)
TSH: 0.602 u[IU]/mL (ref 0.450–4.500)

## 2024-05-31 LAB — CBC/D/PLT+RPR+RH+ABO+RUBIGG...
Antibody Screen: NEGATIVE
Basophils Absolute: 0.1 x10E3/uL (ref 0.0–0.2)
Basos: 1 %
EOS (ABSOLUTE): 0.1 x10E3/uL (ref 0.0–0.4)
Eos: 1 %
HCV Ab: NONREACTIVE
HIV Screen 4th Generation wRfx: NONREACTIVE
Hematocrit: 42.1 % (ref 34.0–46.6)
Hemoglobin: 13.7 g/dL (ref 11.1–15.9)
Hepatitis B Surface Ag: NEGATIVE
Immature Grans (Abs): 0 x10E3/uL (ref 0.0–0.1)
Immature Granulocytes: 0 %
Lymphocytes Absolute: 2.5 x10E3/uL (ref 0.7–3.1)
Lymphs: 23 %
MCH: 28.8 pg (ref 26.6–33.0)
MCHC: 32.5 g/dL (ref 31.5–35.7)
MCV: 89 fL (ref 79–97)
Monocytes Absolute: 0.7 x10E3/uL (ref 0.1–0.9)
Monocytes: 6 %
Neutrophils Absolute: 7.6 x10E3/uL — ABNORMAL HIGH (ref 1.4–7.0)
Neutrophils: 69 %
Platelets: 226 x10E3/uL (ref 150–450)
RBC: 4.75 x10E6/uL (ref 3.77–5.28)
RDW: 12.6 % (ref 11.7–15.4)
RPR Ser Ql: NONREACTIVE
Rh Factor: POSITIVE
Rubella Antibodies, IGG: 1.63 {index} (ref >0.99)
Varicella zoster IgG: NONREACTIVE
WBC: 11 x10E3/uL — ABNORMAL HIGH (ref 3.4–10.8)

## 2024-05-31 LAB — MICROSCOPIC EXAMINATION
Bacteria, UA: NONE SEEN
Casts: NONE SEEN /LPF
RBC, Urine: NONE SEEN /HPF (ref 0–2)

## 2024-05-31 LAB — PROTEIN / CREATININE RATIO, URINE
Creatinine, Urine: 100.4 mg/dL
Protein, Ur: 11.2 mg/dL
Protein/Creat Ratio: 112 mg/g{creat} (ref 0–200)

## 2024-05-31 LAB — HEMOGLOBIN A1C
Est. average glucose Bld gHb Est-mCnc: 100 mg/dL
Hgb A1c MFr Bld: 5.1 % (ref 4.8–5.6)

## 2024-05-31 LAB — HCV INTERPRETATION

## 2024-05-31 NOTE — Telephone Encounter (Signed)
 Pt calling for her PANORAMA PRENATAL TEST pt aware the labs were done yesterday results can take up to 10 days.

## 2024-06-01 LAB — CERVICOVAGINAL ANCILLARY ONLY
Chlamydia: NEGATIVE
Comment: NEGATIVE
Comment: NORMAL
Neisseria Gonorrhea: NEGATIVE

## 2024-06-01 LAB — MONITOR DRUG PROFILE 14(MW)
Amphetamine Scrn, Ur: NEGATIVE ng/mL
BARBITURATE SCREEN URINE: NEGATIVE ng/mL
BENZODIAZEPINE SCREEN, URINE: NEGATIVE ng/mL
Buprenorphine, Urine: NEGATIVE ng/mL
CANNABINOIDS UR QL SCN: NEGATIVE ng/mL
Cocaine (Metab) Scrn, Ur: NEGATIVE ng/mL
Creatinine(Crt), U: 89.6 mg/dL (ref 20.0–300.0)
Fentanyl, Urine: NEGATIVE pg/mL
Meperidine Screen, Urine: NEGATIVE ng/mL
Methadone Screen, Urine: NEGATIVE ng/mL
OXYCODONE+OXYMORPHONE UR QL SCN: NEGATIVE ng/mL
Opiate Scrn, Ur: NEGATIVE ng/mL
Ph of Urine: 5.6 (ref 4.5–8.9)
Phencyclidine Qn, Ur: NEGATIVE ng/mL
Propoxyphene Scrn, Ur: NEGATIVE ng/mL
SPECIFIC GRAVITY: 1.014
Tramadol Screen, Urine: NEGATIVE ng/mL

## 2024-06-01 LAB — CULTURE, OB URINE

## 2024-06-01 LAB — NICOTINE SCREEN, URINE: Cotinine Ql Scrn, Ur: NEGATIVE ng/mL

## 2024-06-01 LAB — URINE CULTURE, OB REFLEX

## 2024-06-02 ENCOUNTER — Ambulatory Visit: Payer: Self-pay | Admitting: Certified Nurse Midwife

## 2024-06-02 LAB — CYTOLOGY - PAP: Diagnosis: NEGATIVE

## 2024-06-06 ENCOUNTER — Ambulatory Visit (INDEPENDENT_AMBULATORY_CARE_PROVIDER_SITE_OTHER): Admitting: Licensed Practical Nurse

## 2024-06-06 ENCOUNTER — Encounter: Payer: Self-pay | Admitting: Certified Nurse Midwife

## 2024-06-06 VITALS — BP 141/77 | HR 91 | Wt 168.5 lb

## 2024-06-06 DIAGNOSIS — O99891 Other specified diseases and conditions complicating pregnancy: Secondary | ICD-10-CM | POA: Diagnosis not present

## 2024-06-06 DIAGNOSIS — N200 Calculus of kidney: Secondary | ICD-10-CM

## 2024-06-06 DIAGNOSIS — Z3A13 13 weeks gestation of pregnancy: Secondary | ICD-10-CM | POA: Diagnosis not present

## 2024-06-06 LAB — PANORAMA PRENATAL TEST FULL PANEL:PANORAMA TEST PLUS 5 ADDITIONAL MICRODELETIONS: FETAL FRACTION: 7.9

## 2024-06-06 MED ORDER — PREMIER PROTEIN SHAKE: 2.0000 [oz_av] | Freq: Four times a day (QID) | ORAL | 3 refills | Status: AC

## 2024-06-06 MED ORDER — OXYCODONE HCL 5 MG PO TABS: 5.0000 mg | ORAL_TABLET | Freq: Four times a day (QID) | ORAL | 0 refills | Status: DC | PRN

## 2024-06-06 NOTE — Progress Notes (Signed)
    Return Prenatal Note   Subjective   23 y.o. H5E8978 at [redacted]w[redacted]d presents for a problem visit .  Patient here for a problem visit  Patient reports:has had pain on her Right lower back since September. The pain is sharp and stabbing, it comes and goes. She has had this pain in the past when she had a kidney stone. She has not taken anything for the pain. The pain is worse when she needs ot empty her bladder. She was seen for her NOB last week, her urine culture was negative, her UA showed crystals. She denies fever, she has had a decreased appetite and nausea. She is currently breastfeeding her 37 month old.   Movement: Absent Contractions: Not present  Objective   Flow sheet Vitals: Pulse Rate: 91 BP: (!) 141/77 Fetal Heart Rate (bpm): 140 Total weight gain: -6 lb 8 oz (-2.948 kg)  General Appearance  No acute distress, well appearing, and well nourished Pulmonary   Normal work of breathing Neurologic   Alert and oriented to person, place, and time Psychiatric   Mood and affect within normal limits  Back No CVAT   Assessment/Plan   Plan  23 y.o. H5E8978 at  [redacted]w[redacted]d presents for problem OB visit. Reviewed prenatal record including previous visit note.  Kidney stone complicating pregnancy -reviewed pt's history, symptoms and exam with Dr Leigh, sxs most consistent with kidney stones, given the pt's early gestation imaging is not recommend to confirm presence of stone nor is medication to treat stone appropriate. Discussed using Tylenol  and short course of narcotics for pain, reviewed concerns for safety of oxy while breastfeeding, Meg verbalized understanding. Also advised to increase hydration. Keep next ROB    No orders of the defined types were placed in this encounter.  No follow-ups on file.   Future Appointments  Date Time Provider Department Center  06/27/2024  8:55 AM Jayne Harlene CROME, CNM [facility] None  07/31/2024  8:15 AM [facility] US  1 [facility] None    For  next visit:  continue with routine prenatal care     Elenor Wildes [facility], CNM  11/09/252:56 PM

## 2024-06-11 ENCOUNTER — Encounter: Payer: Self-pay | Admitting: Licensed Practical Nurse

## 2024-06-11 DIAGNOSIS — O99891 Other specified diseases and conditions complicating pregnancy: Secondary | ICD-10-CM | POA: Insufficient documentation

## 2024-06-11 NOTE — Assessment & Plan Note (Signed)
-  reviewed pt's history, symptoms and exam with Dr Leigh, sxs most consistent with kidney stones, given the pt's early gestation imaging is not recommend to confirm presence of stone nor is medication to treat stone appropriate. Discussed using Tylenol  and short course of narcotics for pain, reviewed concerns for safety of oxy while breastfeeding, Chantae verbalized understanding. Also advised to increase hydration. Keep next ROB

## 2024-06-22 ENCOUNTER — Encounter: Payer: Self-pay | Admitting: Certified Nurse Midwife

## 2024-06-22 ENCOUNTER — Encounter: Payer: Self-pay | Admitting: Licensed Practical Nurse

## 2024-06-22 NOTE — Telephone Encounter (Signed)
 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 There is a position they are willing to allow her to do if a note is provided. Letter provided.

## 2024-06-22 NOTE — Telephone Encounter (Signed)
 Work restriction note already provided.  Patient has appointment 06/27/24 to discuss further.

## 2024-06-26 NOTE — Progress Notes (Unsigned)
    Return Prenatal Note   Subjective   23 y.o. Kristi Bauer at [redacted]w[redacted]d presents for this follow-up prenatal visit.  Patient tired, Kristi Bauer continues with frequent night nursing. 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 Will celebrate his [age] birthday with family later this week! Drinking protein shakes 2x/day in addition to meals but continues to have low appetite. Patient reports: Movement: Present Contractions: Not present  Objective   Flow sheet Vitals: Pulse Rate: (!) 101 BP: 132/80 Fetal Heart Rate (bpm): 145 Total weight gain: -8 lb 4.8 oz (-3.765 kg)  General Appearance  No acute distress, well appearing, and well nourished Pulmonary   Normal work of breathing Neurologic   Alert and oriented to person, place, and time Psychiatric   Mood and affect within normal limits   Assessment/Plan   Plan  23 y.o. Kristi Bauer at [redacted]w[redacted]d presents for follow-up OB visit. Reviewed prenatal record including previous visit note.  Supervision of other normal pregnancy, antepartum Red flag symptoms & how to contact provider reviewed. Anatomy u/s already scheduled.      No orders of the defined types were placed in this encounter.  Return in 4 weeks (on 07/25/2024) for ROB & Ultrasound.   Future Appointments  Date Time Provider Department Center  07/31/2024  8:15 AM [facility] US  1 [facility] None  07/31/2024  9:35 AM Kristi Bauer, CNM [facility] None    For next visit:  continue with routine prenatal care     Harlene Bauer Kristi, CNM  11/25/251:49 PM

## 2024-06-26 NOTE — Patient Instructions (Incomplete)
 Second Trimester of Pregnancy  The second trimester of pregnancy is from week 14 through week 27. This is months 4 through 6 of pregnancy. During the second trimester: Morning sickness is less or has stopped. You may have more energy. You may feel hungry more often. At this time, your unborn baby is growing very fast. At the end of the sixth month, the unborn baby may be up to 12 inches long and weigh about 1 pounds. You will likely start to feel the baby move between 16 and 20 weeks of pregnancy. Body changes during your second trimester Your body continues to change during this time. The changes usually go away after your baby is born. Physical changes You will gain more weight. Your belly will get bigger. You may begin to get stretch marks on your hips, belly, and breasts. Your breasts will keep growing and may hurt. You may get dark spots or blotches on your face. A dark line from your belly button to the pubic area may appear. This line is called linea nigra. Your hair may grow faster and get thicker. Health changes You may have headaches. You may have heartburn. You may pee more often. You may have swollen, bulging veins (varicose veins). You may have trouble pooping (constipation), or swollen veins in the butt that can itch or get painful (hemorrhoids). You may have back pain. This is caused by: Weight gain. Pregnancy hormones that are relaxing the joints in your pelvis. Follow these instructions at home: Medicines Talk to your health care provider if you're taking medicines. Ask if the medicines are safe to take during pregnancy. Your provider may change the medicines that you take. Do not take any medicines unless told to by your provider. Take a prenatal vitamin that has at least 600 micrograms (mcg) of folic acid. Do not use herbal medicines, illegal drugs, or medicines that are not approved by your provider. Eating and drinking While you're pregnant your body needs  extra food for your growing baby. Talk with your provider about what to eat while pregnant. Activity Most women are able to exercise during pregnancy. Exercises may need to change as your pregnancy goes on. Talk to your provider about your activities and exercise routines. Relieving pain and discomfort Wear a good, supportive bra if your breasts hurt. Rest with your legs raised if you have leg cramps or low back pain. Take warm sitz baths to soothe pain from hemorrhoids. Use hemorrhoid cream if your provider says it's okay. Do not douche. Do not use tampons or scented pads. Do not use hot tubs, steam rooms, or saunas. Safety Wear your seatbelt at all times when you're in a car. Talk to your provider if someone hits you, hurts you, or yells at you. Talk with your provider if you're feeling sad or have thoughts of hurting yourself. Lifestyle Certain things can be harmful while you're pregnant. It's best to avoid the following: Do not drink alcohol,smoke, vape, or use products with nicotine or tobacco in them. If you need help quitting, talk with your provider. Avoid cat litter boxes and soil used by cats. These things carry germs that can cause harm to your pregnancy and your baby. General instructions Keep all follow-up visits. It helps you and your unborn baby stay as healthy as possible. Write down your questions. Take them to your prenatal visits. Your provider will: Talk with you about your overall health. Give you advice or refer you to specialists who can help with different needs,  including: Prenatal education classes. Mental health and counseling. Foods and healthy eating. Ask for help if you need help with food. Where to find more information American Pregnancy Association: americanpregnancy.org [employer]: acog.org Office on [employer] Health: [employer] Contact a health care provider if: You have a headache that does not go away  when you take medicine. You have any of these problems: You can't eat or drink. You throw up or feel like you may throw up. You have watery poop (diarrhea) for 2 days or more. You have pain when you pee or your pee smells bad. You have been sick for 2 days or more and are not getting better. Contact your provider right away if: You have any of these coming from your vagina: Abnormal discharge. Bad-smelling fluid. Bleeding. Your baby is moving less than usual. You have contractions, belly cramping, or have pain in your pelvis or lower back. You have symptoms of high blood pressure or preeclampsia. These include: A severe, throbbing headache that does not go away. Sudden or extreme swelling of your face, hands, legs, or feet. Vision problems: You see spots. You have blurry vision. Your eyes are sensitive to light. If you can't reach the provider, go to an urgent care or emergency room. Get help right away if: You faint, become confused, or can't think clearly. You have chest pain or trouble breathing. You have any kind of injury, such as from a fall or a car crash. These symptoms may be an emergency. Call 911 right away. Do not wait to see if the symptoms will go away. Do not drive yourself to the hospital. This information is not intended to replace advice given to you by your health care provider. Make sure you discuss any questions you have with your health care provider. Document Revised: 04/22/2023 Document Reviewed: 11/20/2022 Elsevier Patient Education  2024 [employer].

## 2024-06-27 ENCOUNTER — Ambulatory Visit: Admitting: Certified Nurse Midwife

## 2024-06-27 VITALS — BP 132/80 | HR 101 | Wt 166.7 lb

## 2024-06-27 DIAGNOSIS — Z3A16 16 weeks gestation of pregnancy: Secondary | ICD-10-CM

## 2024-06-27 DIAGNOSIS — Z3482 Encounter for supervision of other normal pregnancy, second trimester: Secondary | ICD-10-CM

## 2024-06-27 DIAGNOSIS — Z3480 Encounter for supervision of other normal pregnancy, unspecified trimester: Secondary | ICD-10-CM

## 2024-06-27 NOTE — Assessment & Plan Note (Signed)
 Red flag symptoms & how to contact provider reviewed. Anatomy u/s already scheduled.

## 2024-07-28 NOTE — Patient Instructions (Signed)
 Second Trimester of Pregnancy  The second trimester of pregnancy is from week 14 through week 27. This is months 4 through 6 of pregnancy. During the second trimester: Morning sickness is less or has stopped. You may have more energy. You may feel hungry more often. At this time, your unborn baby is growing very fast. At the end of the sixth month, the unborn baby may be up to 12 inches long and weigh about 1 pounds. You will likely start to feel the baby move between 16 and 20 weeks of pregnancy. Body changes during your second trimester Your body continues to change during this time. The changes usually go away after your baby is born. Physical changes You will gain more weight. Your belly will get bigger. You may begin to get stretch marks on your hips, belly, and breasts. Your breasts will keep growing and may hurt. You may get dark spots or blotches on your face. A dark line from your belly button to the pubic area may appear. This line is called linea nigra. Your hair may grow faster and get thicker. Health changes You may have headaches. You may have heartburn. You may pee more often. You may have swollen, bulging veins (varicose veins). You may have trouble pooping (constipation), or swollen veins in the butt that can itch or get painful (hemorrhoids). You may have back pain. This is caused by: Weight gain. Pregnancy hormones that are relaxing the joints in your pelvis. Follow these instructions at home: Medicines Talk to your health care provider if you're taking medicines. Ask if the medicines are safe to take during pregnancy. Your provider may change the medicines that you take. Do not take any medicines unless told to by your provider. Take a prenatal vitamin that has at least 600 micrograms (mcg) of folic acid. Do not use herbal medicines, illegal drugs, or medicines that are not approved by your provider. Eating and drinking While you're pregnant your body needs  extra food for your growing baby. Talk with your provider about what to eat while pregnant. Activity Most women are able to exercise during pregnancy. Exercises may need to change as your pregnancy goes on. Talk to your provider about your activities and exercise routines. Relieving pain and discomfort Wear a good, supportive bra if your breasts hurt. Rest with your legs raised if you have leg cramps or low back pain. Take warm sitz baths to soothe pain from hemorrhoids. Use hemorrhoid cream if your provider says it's okay. Do not douche. Do not use tampons or scented pads. Do not use hot tubs, steam rooms, or saunas. Safety Wear your seatbelt at all times when you're in a car. Talk to your provider if someone hits you, hurts you, or yells at you. Talk with your provider if you're feeling sad or have thoughts of hurting yourself. Lifestyle Certain things can be harmful while you're pregnant. It's best to avoid the following: Do not drink alcohol,smoke, vape, or use products with nicotine or tobacco in them. If you need help quitting, talk with your provider. Avoid cat litter boxes and soil used by cats. These things carry germs that can cause harm to your pregnancy and your baby. General instructions Keep all follow-up visits. It helps you and your unborn baby stay as healthy as possible. Write down your questions. Take them to your prenatal visits. Your provider will: Talk with you about your overall health. Give you advice or refer you to specialists who can help with different needs,  including: Prenatal education classes. Mental health and counseling. Foods and healthy eating. Ask for help if you need help with food. Where to find more information American Pregnancy Association: americanpregnancy.org [employer]: acog.org Office on [employer] Health: [employer] Contact a health care provider if: You have a headache that does not go away  when you take medicine. You have any of these problems: You can't eat or drink. You throw up or feel like you may throw up. You have watery poop (diarrhea) for 2 days or more. You have pain when you pee or your pee smells bad. You have been sick for 2 days or more and are not getting better. Contact your provider right away if: You have any of these coming from your vagina: Abnormal discharge. Bad-smelling fluid. Bleeding. Your baby is moving less than usual. You have contractions, belly cramping, or have pain in your pelvis or lower back. You have symptoms of high blood pressure or preeclampsia. These include: A severe, throbbing headache that does not go away. Sudden or extreme swelling of your face, hands, legs, or feet. Vision problems: You see spots. You have blurry vision. Your eyes are sensitive to light. If you can't reach the provider, go to an urgent care or emergency room. Get help right away if: You faint, become confused, or can't think clearly. You have chest pain or trouble breathing. You have any kind of injury, such as from a fall or a car crash. These symptoms may be an emergency. Call 911 right away. Do not wait to see if the symptoms will go away. Do not drive yourself to the hospital. This information is not intended to replace advice given to you by your health care provider. Make sure you discuss any questions you have with your health care provider. Document Revised: 04/22/2023 Document Reviewed: 11/20/2022 Elsevier Patient Education  2024 [employer].

## 2024-07-28 NOTE — Progress Notes (Signed)
" ° ° °  Return Prenatal Note   Subjective   23 y.o. H5E8978 at [redacted]w[redacted]d presents for this follow-up prenatal visit.  Patient feeling well, [name] teething so still not sleeping much. Has had some dizziness with position changes. Anatomy u/s today, preliminary report reviewed.  Patient reports: Movement: Present Contractions: Not present  Objective   Flow sheet Vitals: Pulse Rate: 81 BP: 127/75 Fetal Heart Rate (bpm): 150 Total weight gain: -4 lb 1.6 oz (-1.86 kg)  General Appearance  No acute distress, well appearing, and well nourished Pulmonary   Normal work of breathing Neurologic   Alert and oriented to person, place, and time Psychiatric   Mood and affect within normal limits   Assessment/Plan   Plan  23 y.o. H5E8978 at [redacted]w[redacted]d presents for follow-up OB visit. Reviewed prenatal record including previous visit note.  Supervision of other normal pregnancy, antepartum Red flag symptoms reviewed. Monitor when dizziness occurs, may be low blood sugar or dehydration, add one electrolyte drink daily & increase hydration-drinking 64oz/day.      No orders of the defined types were placed in this encounter.  No follow-ups on file.   Future Appointments  Date Time Provider Department Center  08/28/2024  8:15 AM Justino Eleanor HERO, CNM [facility] None     For next visit:  continue with routine prenatal care     Harlene LITTIE Cisco, CNM  12/29/20253:35 PM  "

## 2024-07-31 ENCOUNTER — Ambulatory Visit

## 2024-07-31 ENCOUNTER — Ambulatory Visit: Admitting: Certified Nurse Midwife

## 2024-07-31 ENCOUNTER — Encounter: Payer: Self-pay | Admitting: Certified Nurse Midwife

## 2024-07-31 ENCOUNTER — Other Ambulatory Visit: Payer: Self-pay

## 2024-07-31 VITALS — BP 127/75 | HR 81 | Wt 170.9 lb

## 2024-07-31 DIAGNOSIS — Z3A21 21 weeks gestation of pregnancy: Secondary | ICD-10-CM

## 2024-07-31 DIAGNOSIS — Z363 Encounter for antenatal screening for malformations: Secondary | ICD-10-CM

## 2024-07-31 DIAGNOSIS — Z3482 Encounter for supervision of other normal pregnancy, second trimester: Secondary | ICD-10-CM

## 2024-07-31 DIAGNOSIS — Z3480 Encounter for supervision of other normal pregnancy, unspecified trimester: Secondary | ICD-10-CM

## 2024-07-31 NOTE — Assessment & Plan Note (Signed)
 Red flag symptoms reviewed. Monitor when dizziness occurs, may be low blood sugar or dehydration, add one electrolyte drink daily & increase hydration-drinking 64oz/day.

## 2024-08-08 ENCOUNTER — Encounter: Payer: Self-pay | Admitting: Certified Nurse Midwife

## 2024-08-28 ENCOUNTER — Encounter: Admitting: Obstetrics

## 2024-08-29 ENCOUNTER — Encounter: Admitting: Registered Nurse

## 2024-08-29 NOTE — Progress Notes (Unsigned)
" ° ° °  Return Prenatal Note   Assessment/Plan   Plan  24 y.o. H5E8978 at [redacted]w[redacted]d presents for follow-up OB visit. Reviewed prenatal record including previous visit note.  Supervision of other normal pregnancy, antepartum -Encouraged frequent snacks and adequate hydration to manage episodes of lightheadedness -Discussed 28-week labs at next visit -Feels mood is good on current dose of Zoloft  -Reviewed kick counts and preterm labor warning signs. Instructed to call office or come to hospital with persistent headache, vision changes, regular contractions, leaking of fluid, decreased fetal movement or vaginal bleeding.    Stress incontinence during pregnancy -Referral sent for pelvic PT    Orders Placed This Encounter  Procedures   28 Week RH+Panel    Standing Status:   Future    Expected Date:   09/13/2024    Expiration Date:   09/30/2024   Ambulatory referral to Physical Therapy    Referral Priority:   Routine    Referral Type:   Physical Medicine    Referral Reason:   Specialty Services Required    Requested Specialty:   Physical Therapy    Number of Visits Requested:   1   Return in about 3 weeks (around 09/20/2024).   Future Appointments  Date Time Provider Department Center  09/20/2024  2:40 PM [facility] None  09/20/2024  3:15 PM Jayne Harlene CROME, CNM [facility] None    For next visit:  ROB with 28-week labs and TDaP    Subjective   Jaryah's appetite has improved and she has gained some weight. She is having periods of lightheadedness that seem to improve with juice and crackers. She has been leaking urine when she sneezes and would like a referral to PT. She has also noted pelvic pain.  Movement: Present Contractions: Not present  Objective   Flow sheet Vitals: Pulse Rate: 91 BP: 102/66 Fundal Height: 24 cm Fetal Heart Rate (bpm): 152 Total weight gain: 3 lb (1.361 kg)  General Appearance  No acute distress, well appearing, and well  nourished Pulmonary   Normal work of breathing Neurologic   Alert and oriented to person, place, and time Psychiatric   Mood and affect within normal limits  Eleanor Canny, CNM 08/30/24 10:20 AM  "

## 2024-08-30 ENCOUNTER — Other Ambulatory Visit: Payer: Self-pay

## 2024-08-30 ENCOUNTER — Ambulatory Visit: Admitting: Obstetrics

## 2024-08-30 VITALS — BP 102/66 | HR 91 | Wt 178.0 lb

## 2024-08-30 DIAGNOSIS — N393 Stress incontinence (female) (male): Secondary | ICD-10-CM | POA: Insufficient documentation

## 2024-08-30 DIAGNOSIS — Z6832 Body mass index (BMI) 32.0-32.9, adult: Secondary | ICD-10-CM | POA: Insufficient documentation

## 2024-08-30 DIAGNOSIS — Z130 Encounter for screening for diseases of the blood and blood-forming organs and certain disorders involving the immune mechanism: Secondary | ICD-10-CM

## 2024-08-30 DIAGNOSIS — Z3A25 25 weeks gestation of pregnancy: Secondary | ICD-10-CM

## 2024-08-30 DIAGNOSIS — O99891 Other specified diseases and conditions complicating pregnancy: Secondary | ICD-10-CM

## 2024-08-30 DIAGNOSIS — Z113 Encounter for screening for infections with a predominantly sexual mode of transmission: Secondary | ICD-10-CM

## 2024-08-30 DIAGNOSIS — Z3482 Encounter for supervision of other normal pregnancy, second trimester: Secondary | ICD-10-CM

## 2024-08-30 DIAGNOSIS — Z131 Encounter for screening for diabetes mellitus: Secondary | ICD-10-CM

## 2024-08-30 DIAGNOSIS — Z3480 Encounter for supervision of other normal pregnancy, unspecified trimester: Secondary | ICD-10-CM

## 2024-08-30 NOTE — Assessment & Plan Note (Addendum)
-  Encouraged frequent snacks and adequate hydration to manage episodes of lightheadedness -Discussed 28-week labs at next visit -Feels mood is good on current dose of Zoloft  -Reviewed kick counts and preterm labor warning signs. Instructed to call office or come to hospital with persistent headache, vision changes, regular contractions, leaking of fluid, decreased fetal movement or vaginal bleeding.

## 2024-08-30 NOTE — Assessment & Plan Note (Signed)
 Referral sent for pelvic PT

## 2024-08-31 ENCOUNTER — Encounter: Payer: Self-pay | Admitting: Certified Nurse Midwife

## 2024-09-20 ENCOUNTER — Encounter: Admitting: Certified Nurse Midwife

## 2024-09-20 ENCOUNTER — Other Ambulatory Visit
